# Patient Record
Sex: Male | Born: 1960
Health system: Southern US, Community
[De-identification: ages and names within clinical notes are randomized; demographics above are authoritative.]

## PROBLEM LIST (undated history)

## (undated) DIAGNOSIS — I10 Essential (primary) hypertension: Secondary | ICD-10-CM

## (undated) DIAGNOSIS — T7840XA Allergy, unspecified, initial encounter: Secondary | ICD-10-CM

## (undated) DIAGNOSIS — K219 Gastro-esophageal reflux disease without esophagitis: Secondary | ICD-10-CM

## (undated) DIAGNOSIS — E785 Hyperlipidemia, unspecified: Secondary | ICD-10-CM

## (undated) HISTORY — PX: COLONOSCOPY: SHX174

## (undated) HISTORY — DX: Allergy, unspecified, initial encounter: T78.40XA

## (undated) HISTORY — PX: ANTERIOR CRUCIATE LIGAMENT REPAIR: SHX115

## (undated) HISTORY — DX: Essential (primary) hypertension: I10

## (undated) HISTORY — PX: STENT PLACEMENT ILIAC (ARMC HX): HXRAD1735

## (undated) HISTORY — DX: Gastro-esophageal reflux disease without esophagitis: K21.9

## (undated) HISTORY — DX: Hyperlipidemia, unspecified: E78.5

---

## 2006-11-12 DIAGNOSIS — M109 Gout, unspecified: Secondary | ICD-10-CM | POA: Insufficient documentation

## 2006-11-12 HISTORY — DX: Gout, unspecified: M10.9

## 2008-11-12 HISTORY — PX: OTHER SURGICAL HISTORY: SHX169

## 2008-11-22 ENCOUNTER — Ambulatory Visit: Payer: Self-pay | Admitting: General Practice

## 2009-03-31 ENCOUNTER — Inpatient Hospital Stay: Payer: Self-pay | Admitting: Specialist

## 2009-03-31 ENCOUNTER — Ambulatory Visit: Payer: Self-pay | Admitting: Cardiology

## 2009-05-12 ENCOUNTER — Ambulatory Visit: Payer: Self-pay | Admitting: Specialist

## 2011-02-22 IMAGING — CR DG KNEE COMPLETE 4+V*L*
1 series · 4 of 4 positions shown · non-contrast
Comparison: none

REASON FOR EXAM: fall heard knee "pop," pain swelling
COMMENTS:

PROCEDURE:     DXR - DXR KNEE LT COMP WITH OBLIQUES  - March 31, 2009 [DATE]
RESULT:     No fracture, dislocation or other acute bony abnormality is
identified. The knee joint space is well maintained. The patella is intact.

[Series 1: view not recorded · 0.17mm/px · 4 of 4 slices shown]
[im 1/4]
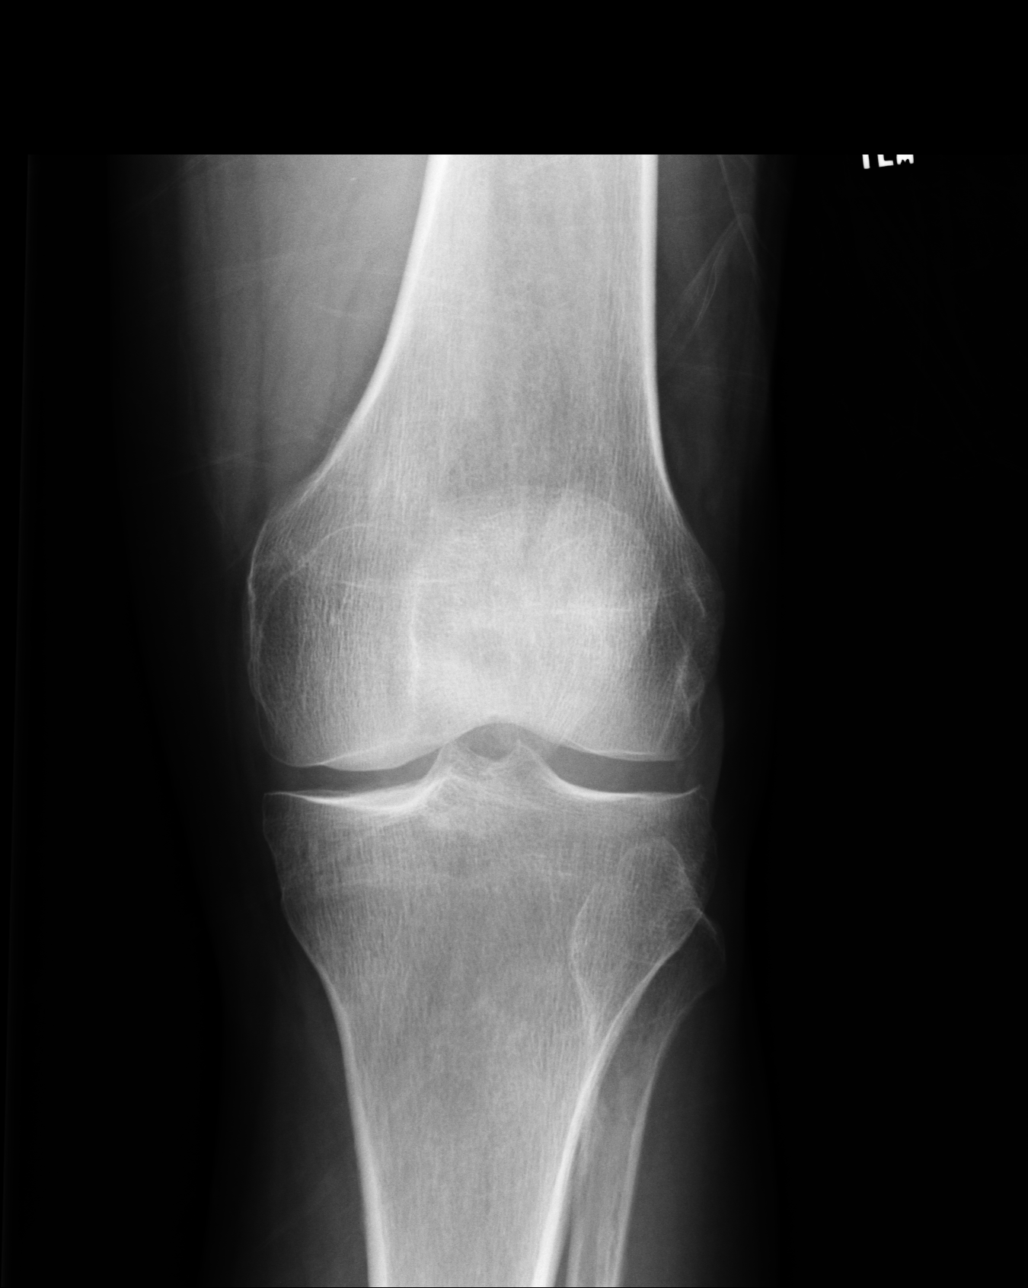
[im 2/4]
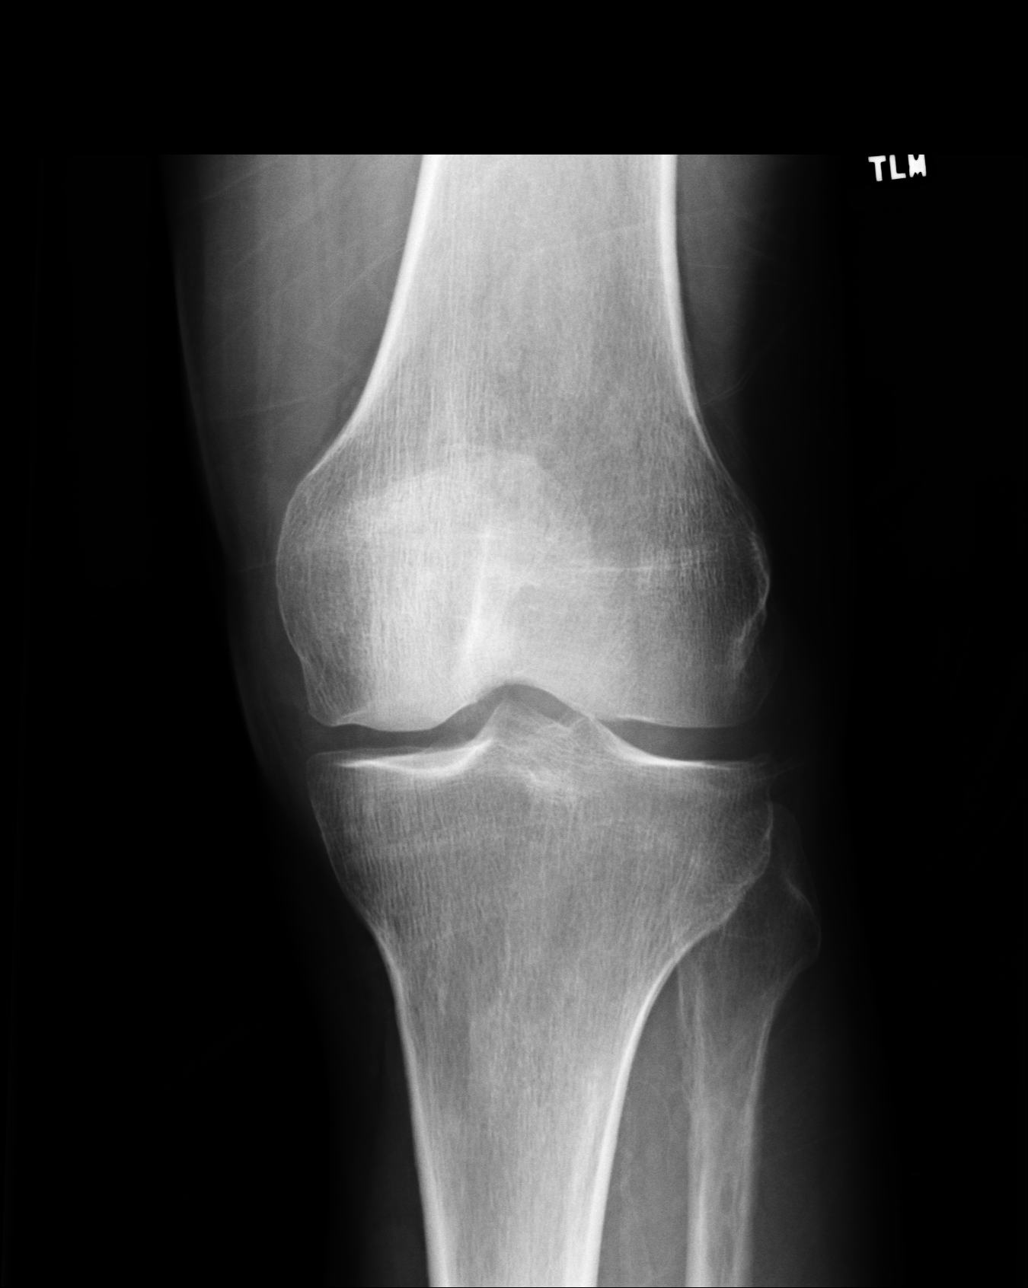
[im 3/4]
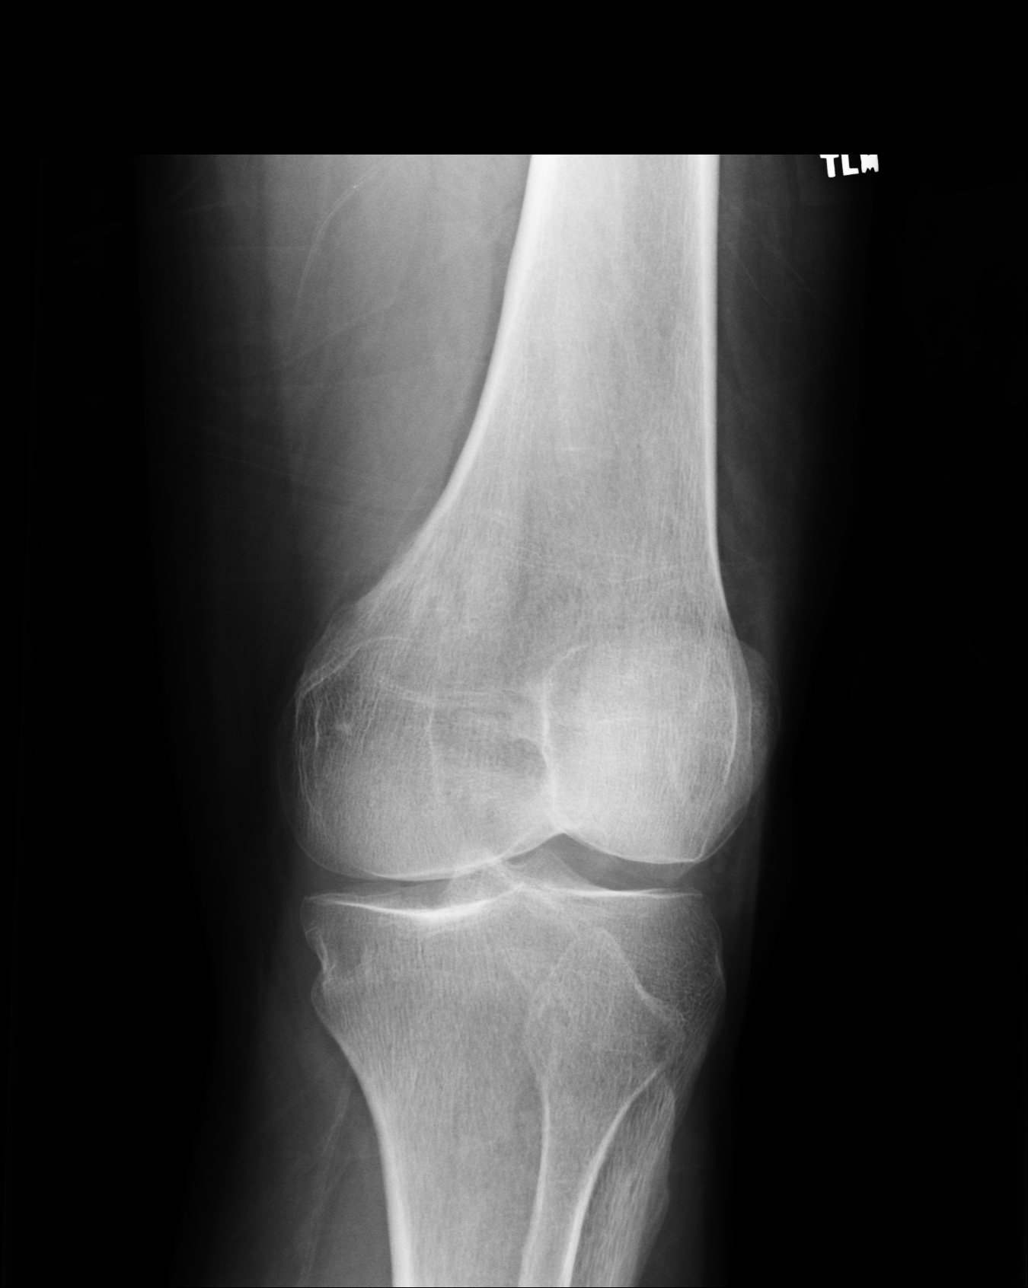
[im 4/4]
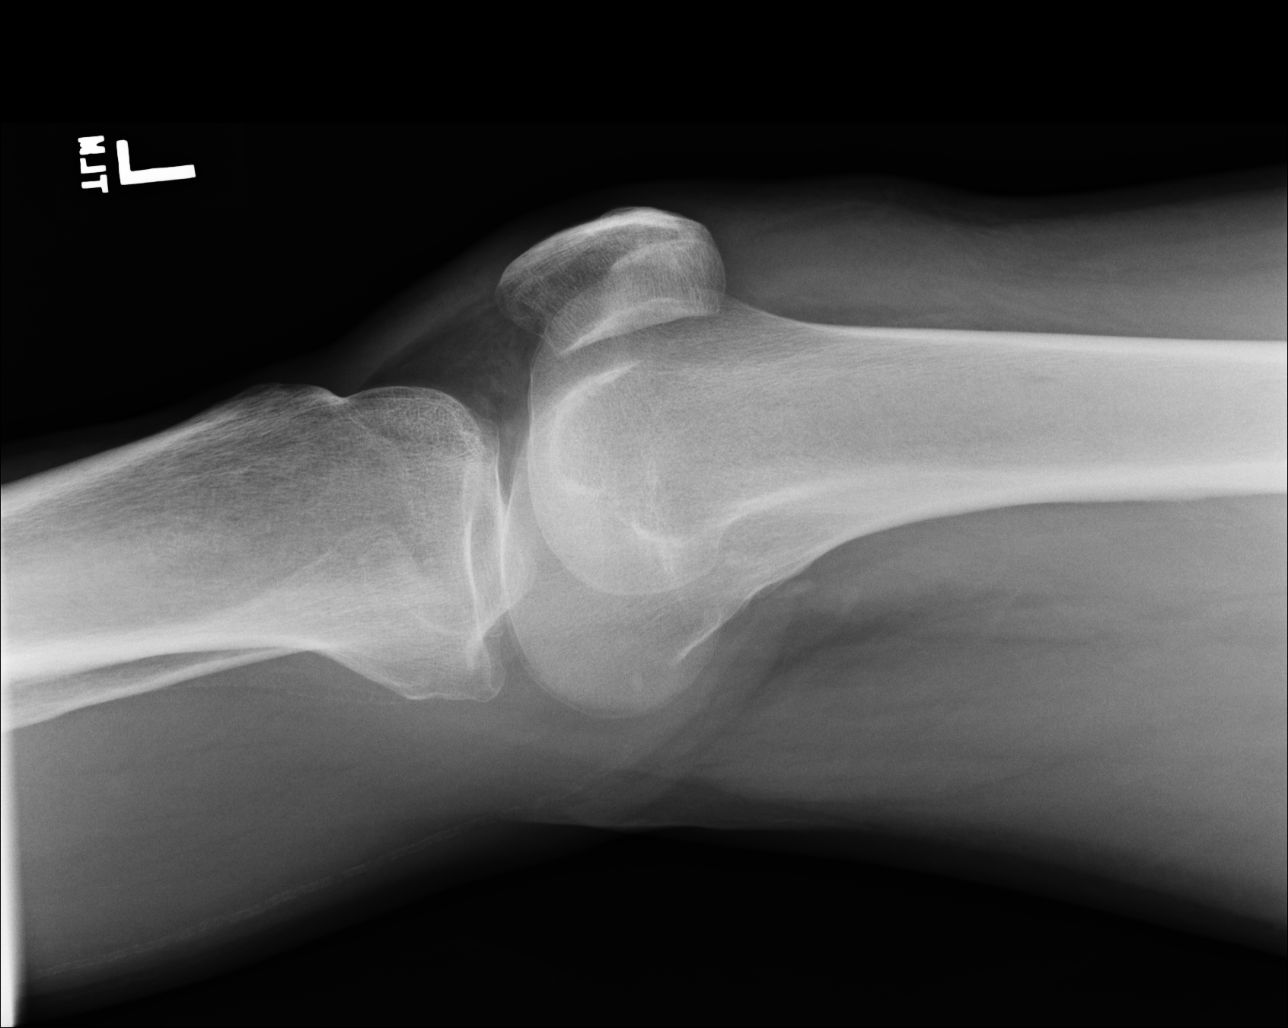

[4 of 4 positions shown; findings below may reference images not displayed]

IMPRESSION: No acute changes are identified.

## 2013-05-12 ENCOUNTER — Ambulatory Visit: Payer: Self-pay | Admitting: Specialist

## 2013-05-12 LAB — POTASSIUM: Potassium: 4.3 mmol/L (ref 3.5–5.1)

## 2013-05-12 LAB — PROTIME-INR
INR: 0.9
Prothrombin Time: 12.1 secs (ref 11.5–14.7)

## 2013-05-12 LAB — URINALYSIS, COMPLETE
Glucose,UR: NEGATIVE mg/dL (ref 0–75)
Ketone: NEGATIVE
Leukocyte Esterase: NEGATIVE
Nitrite: NEGATIVE
Protein: NEGATIVE
RBC,UR: 6 /HPF (ref 0–5)
Squamous Epithelial: <1
WBC UR: 1 /HPF (ref 0–5)

## 2013-05-12 LAB — APTT: Activated PTT: 27.8 secs (ref 23.6–35.9)

## 2013-05-12 LAB — MRSA PCR SCREENING

## 2013-05-20 ENCOUNTER — Inpatient Hospital Stay: Payer: Self-pay | Admitting: Specialist

## 2013-05-21 LAB — HEMOGLOBIN: HGB: 12.4 g/dL — ABNORMAL LOW (ref 13.0–18.0)

## 2013-05-22 LAB — CREATININE, SERUM: EGFR (Non-African Amer.): >60

## 2013-05-22 LAB — PATHOLOGY REPORT

## 2014-06-02 IMAGING — CR PELVIS - 1-2 VIEW
1 series · 1 of 1 positions shown · non-contrast
Comparison: none

REASON FOR EXAM: left hip prosthesis
COMMENTS:

PROCEDURE:     DXR - DXR PELVIS AP ONLY  - May 20, 2013 [DATE]
RESULT:

[ap]
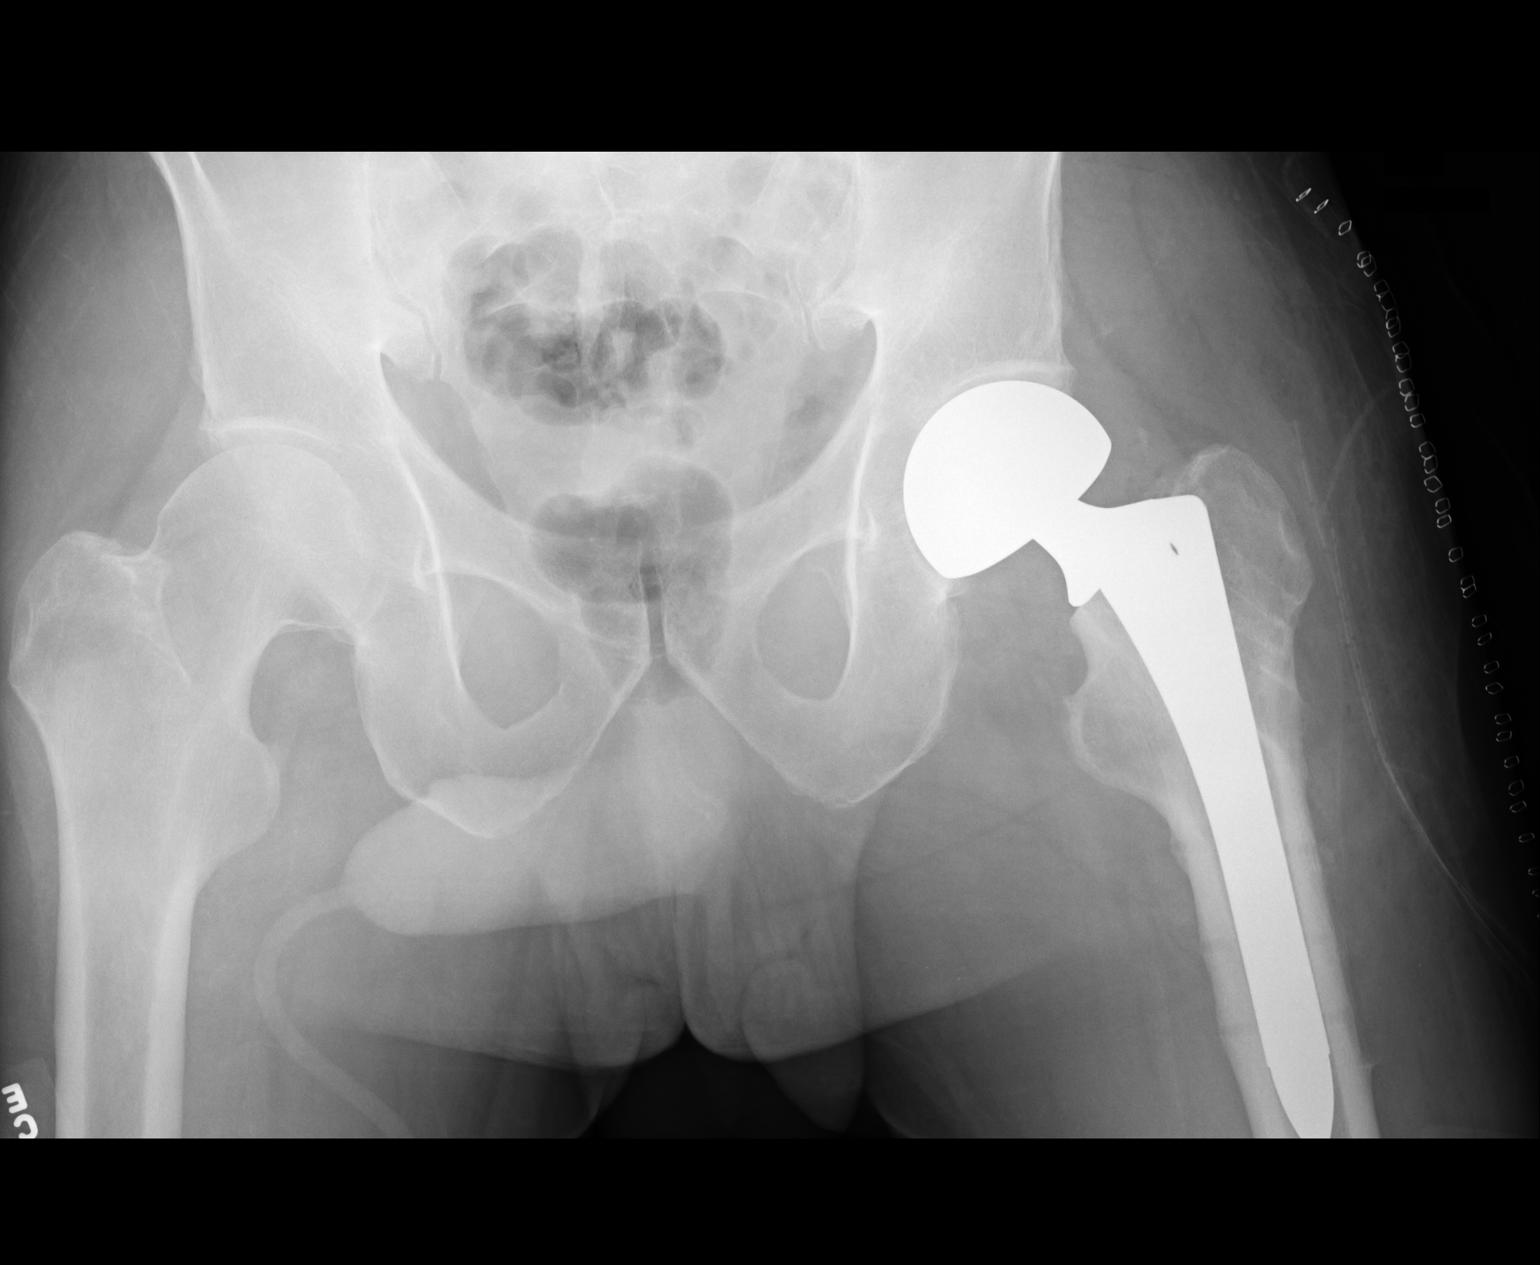

[1 of 1 positions shown; findings below may reference images not displayed]

FINDINGS: The patient is status post left hip replacement. Hardware appears
intact without evidence of loosening or failure. The native osseous
structures demonstrate no evidence of fracture. Skin staples and surgical
drains are appreciated about the left hip.
IMPRESSION: 1. The patient is status post left hip replacement. The remainder of the
interpretation will be left to the performing physician.

## 2015-03-04 NOTE — Discharge Summary (Signed)
PATIENT NAME:  Larry Johnson, Larry Johnson MR#:  [id number] DATE OF BIRTH:  1961-09-27  DATE OF ADMISSION:  05/20/2013 DATE OF DISCHARGE:  05/23/2013  DISCHARGE DIAGNOSES: 1.  Avascular necrosis, left femoral head.  2.  Gastroesophageal reflux disease.  3.  Gout.   OPERATIONS OR PROCEDURES PERFORMED: Bipolar hemiarthroplasty on 05/20/2013.   HISTORY AND PHYSICAL EXAMINATION: As written on admission.   LABORATORY DATA: As noted in the chart.   HOSPITAL COURSE: The patient was admitted through same day surgery and underwent uncomplicated bipolar hemiarthroplasty on 05/20/2013. He tolerated the procedure quite well. He did have significant nausea and vomiting during the admission, which eventually resolved. He was advanced up into the chair in physical therapy with total hip precautions with partial weight-bearing. He was discharged on 05/23/2013 doing well. He was discharged home with home health physical therapy. He was discharged on his previous home medications and on oxycodone 10 mg every 3 hours as necessary for pain. He also is to take aspirin 325 mg twice a day with food as prophylaxis for venous thrombosis and pulmonary embolism. He is to return to the office in 10 days for staple removal. ____________________________ Clare Gandyhristopher E. Ivon Oelkers, MD ces:sb D: 06/09/2013 08:15:37 ET T: 06/09/2013 08:25:30 ET JOB#: [id number]  cc: Clare Gandyhristopher E. Tyr Franca, MD, <Dictator> Clare GandyHRISTOPHER E Alfred Harrel MD ELECTRONICALLY SIGNED 06/09/2013 13:43

## 2015-03-04 NOTE — Op Note (Signed)
PATIENT NAME:  Larry Johnson, Larry Johnson MR#:  [id number] DATE OF BIRTH:  February 07, 1961  DATE OF PROCEDURE:  05/20/2013  PREOPERATIVE DIAGNOSES: Avascular necrosis, left hip.   POSTOPERATIVE DIAGNOSIS: Avascular necrosis, left hip.   PROCEDURE PERFORMED:  1. Bipolar prosthetic hip arthroplasty.  2. Hardware removal, left hip.   SURGEON: Clare Gandyhristopher E. Niara Bunker, MD  ASSISTANT: Valinda HoarHoward E. Miller, MD  ANESTHESIA: Spinal.   COMPLICATIONS: None.   DRAINS: Two Autovacs.   ESTIMATED BLOOD LOSS: 500 mL.   DESCRIPTION OF PROCEDURE: Ancef 2 grams was given intravenously prior to the procedure. Spinal anesthesia is induced. The patient is turned over into the right lateral decubitus position and secured in the usual manner for left hip surgery. The left hip is thoroughly prepped with alcohol and ChloraPrep and draped in standard sterile fashion. A standard posterolateral incision is made, and this is extended distally to account for hardware removal. The three 7.3 cannulated screws are removed as well as the 3 cortical screws in the femoral plate, and the femoral plate and the compression screw in the femoral head and neck. The compression screw in the femoral head and neck had to be removed retrograde after cutting the femoral head. The hip was dislocated, and the femoral head was cut, and the compression screw, as noted above, was removed. The femoral head and neck was then cut off at the appropriate angle and length. It should be noted that careful preservation of the piriformis tendon, which was tagged, and also the capsule, in which a T incision was made, was performed at all times. Sciatic nerve was palpated deep within the wound and was protected throughout the case. The acetabulum was fully inspected and was seen to be excellent, with excellent cartilaginous surfaces. It was felt that bipolar arthroplasty could therefore be performed. The proximal femur was carefully reamed up to a 16 diameter, and then the 6.5  narrow rasp was used. The acetabular sizers were used to size the acetabulum at 55. The joint and all surfaces were thoroughly irrigated multiple times with a pulsatile lavage. The 55 mm bipolar arthroplasty with the 1.5 mm neck was reduced and was seen to be stable with equal leg lengths. These components were then dislocated and removed, and the joint was thoroughly irrigated multiple times, as it was throughout the case. The 16.5 narrow AML porous-coated stem was then impacted into place and was seen to be a solid fit. The 1.5 neck length ball with a 55 mm in diameter DePuy bipolar component was impacted over the femoral stem and neck. This was felt to be secure. The hip was then reduced and was seen to be excellent with respect to stability and leg length. The wound is thoroughly irrigated multiple times again. The posterior capsule is repaired with multiple #2 Ethibond sutures. The piriformis tendon is reattached to the abductor mechanism. The posterior capsule is advanced and repaired to the posterior abductor mechanism. The wound is thoroughly irrigated multiple times again. Two Autovac drains are brought out through separate stab wound incisions. The fascia lata is closed with #2 Tycron. The subcutaneous tissue is closed with #1 Vicryl, and the skin is closed with the skin stapler. A soft bulky dressing is applied. The patient is turned over into the hospital bed and the legs placed in the abduction pillow. The patient was returned to the recovery room in satisfactory condition having tolerated the procedure quite well.   ____________________________ Clare Gandyhristopher E. Reighn Kaplan, MD ces:OSi D: 05/20/2013 11:05:31 ET T: 05/20/2013 11:16:56  ET JOB#: [id number]  cc: Clare Gandy, MD, <Dictator> Clare Gandy MD ELECTRONICALLY SIGNED 05/21/2013 8:29

## 2015-04-13 IMAGING — CR DG HIP 1V*L*
1 series · 1 of 1 positions shown · non-contrast
Comparison: none

REASON FOR EXAM: left hip prosthesis
COMMENTS:

PROCEDURE:     DXR - DXR HIP LEFT ONE VIEW  - May 20, 2013 [DATE]
RESULT:

[lat]
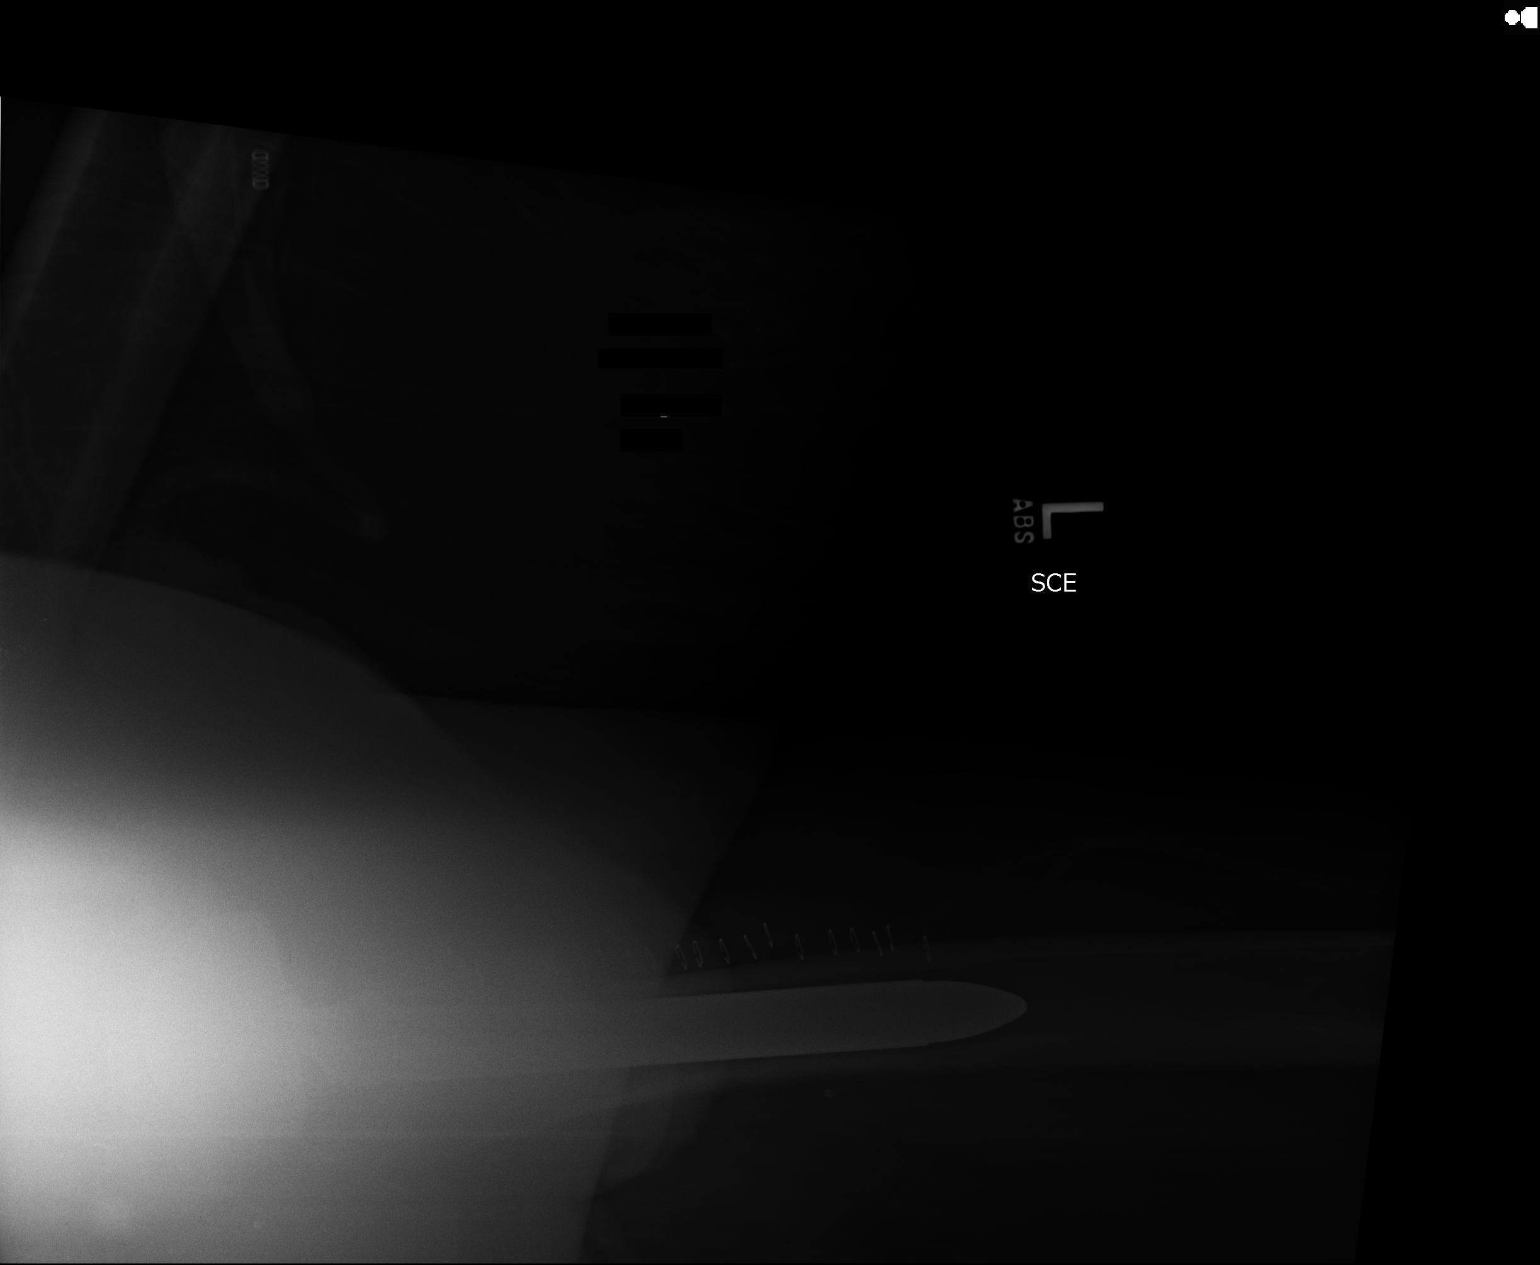

[1 of 1 positions shown; findings below may reference images not displayed]

FINDINGS: Left hip prosthesis is appreciated and appears grossly intact.
This study is limited secondary to technique. Skin staples are appreciated.
IMPRESSION: 1. The patient is status post left hip replacement. The remainder of the
interpretation will be left to the performing physician.

## 2016-07-13 DIAGNOSIS — I2510 Atherosclerotic heart disease of native coronary artery without angina pectoris: Secondary | ICD-10-CM

## 2016-07-13 HISTORY — DX: Atherosclerotic heart disease of native coronary artery without angina pectoris: I25.10

## 2016-07-17 DIAGNOSIS — K219 Gastro-esophageal reflux disease without esophagitis: Secondary | ICD-10-CM | POA: Insufficient documentation

## 2016-07-17 DIAGNOSIS — R079 Chest pain, unspecified: Secondary | ICD-10-CM | POA: Insufficient documentation

## 2016-07-18 DIAGNOSIS — I200 Unstable angina: Secondary | ICD-10-CM | POA: Insufficient documentation

## 2016-07-19 DIAGNOSIS — D696 Thrombocytopenia, unspecified: Secondary | ICD-10-CM | POA: Insufficient documentation

## 2017-09-24 DIAGNOSIS — M1990 Unspecified osteoarthritis, unspecified site: Secondary | ICD-10-CM | POA: Insufficient documentation

## 2019-03-31 ENCOUNTER — Other Ambulatory Visit: Payer: Self-pay

## 2019-04-01 LAB — CMP12+LP+TP+TSH+6AC+PSA+CBC?
ALT: 71 IU/L — ABNORMAL HIGH (ref 0–44)
AST: 41 IU/L — ABNORMAL HIGH (ref 0–40)
Albumin: 4.6 g/dL (ref 3.8–4.9)
BUN/Creatinine Ratio: 13 (ref 10–24)
Basophils Absolute: 0.1 10*3/uL (ref 0.0–0.2)
Calcium: 9.6 mg/dL (ref 8.6–10.2)
Chloride: 104 mmol/L (ref 96–106)
Chol/HDL Ratio: 3.4 ratio (ref 0.0–5.0)
Eos: 7 %
Estimated CHD Risk: 0.5 times avg. (ref 0.0–1.0)
Free Thyroxine Index: 1.7 (ref 1.2–4.9)
GFR calc Af Amer: 109 mL/min/{1.73_m2} (ref >59)
Hemoglobin: 14.3 g/dL (ref 13.0–17.7)
Immature Granulocytes: 0 %
LDH: 174 IU/L (ref 121–224)
MCV: 91 fL (ref 79–97)
Monocytes: 9 %
Neutrophils Absolute: 2.3 10*3/uL (ref 1.4–7.0)
Neutrophils: 46 %
Phosphorus: 3.4 mg/dL (ref 2.8–4.1)
Platelets: 150 10*3/uL (ref 150–450)
RBC: 4.64 x10E6/uL (ref 4.14–5.80)

## 2019-04-01 LAB — CMP12+LP+TP+TSH+6AC+PSA+CBC…
Albumin/Globulin Ratio: 1.9 (ref 1.2–2.2)
Alkaline Phosphatase: 95 IU/L (ref 39–117)
BUN: 11 mg/dL (ref 8–27)
Basos: 1 %
Bilirubin Total: 0.5 mg/dL (ref 0.0–1.2)
Cholesterol, Total: 152 mg/dL (ref 100–199)
Creatinine, Ser: 0.85 mg/dL (ref 0.76–1.27)
EOS (ABSOLUTE): 0.4 10*3/uL (ref 0.0–0.4)
GFR calc non Af Amer: 95 mL/min/{1.73_m2} (ref >59)
GGT: 102 IU/L — ABNORMAL HIGH (ref 0–65)
Globulin, Total: 2.4 g/dL (ref 1.5–4.5)
Glucose: 97 mg/dL (ref 65–99)
HDL: 45 mg/dL (ref >39)
Hematocrit: 42.2 % (ref 37.5–51.0)
Immature Grans (Abs): 0 10*3/uL (ref 0.0–0.1)
Iron: 102 ug/dL (ref 38–169)
LDL Calculated: 59 mg/dL (ref 0–99)
Lymphocytes Absolute: 1.8 10*3/uL (ref 0.7–3.1)
Lymphs: 37 %
MCH: 30.8 pg (ref 26.6–33.0)
MCHC: 33.9 g/dL (ref 31.5–35.7)
Monocytes Absolute: 0.4 10*3/uL (ref 0.1–0.9)
Potassium: 4.4 mmol/L (ref 3.5–5.2)
Prostate Specific Ag, Serum: 0.6 ng/mL (ref 0.0–4.0)
RDW: 13.4 % (ref 11.6–15.4)
Sodium: 143 mmol/L (ref 134–144)
T3 Uptake Ratio: 25 % (ref 24–39)
T4, Total: 6.7 ug/dL (ref 4.5–12.0)
TSH: 2.34 u[IU]/mL (ref 0.450–4.500)
Total Protein: 7 g/dL (ref 6.0–8.5)
Triglycerides: 238 mg/dL — ABNORMAL HIGH (ref 0–149)
Uric Acid: 4.3 mg/dL (ref 3.7–8.6)
VLDL Cholesterol Cal: 48 mg/dL — ABNORMAL HIGH (ref 5–40)
WBC: 5 10*3/uL (ref 3.4–10.8)

## 2019-09-24 ENCOUNTER — Other Ambulatory Visit: Payer: Self-pay

## 2019-09-24 DIAGNOSIS — K219 Gastro-esophageal reflux disease without esophagitis: Secondary | ICD-10-CM

## 2019-09-25 ENCOUNTER — Other Ambulatory Visit: Payer: Self-pay | Admitting: Physician Assistant

## 2019-09-25 MED ORDER — OMEPRAZOLE 20 MG PO CPDR: DELAYED_RELEASE_CAPSULE | ORAL | 1 refills | Status: DC

## 2019-09-25 NOTE — Progress Notes (Signed)
Up to Date with routine annual Dr Roxan Hockey 03/2019- continue meds as ordered

## 2019-10-02 ENCOUNTER — Other Ambulatory Visit: Payer: Self-pay

## 2019-10-02 DIAGNOSIS — I10 Essential (primary) hypertension: Secondary | ICD-10-CM

## 2019-10-02 MED ORDER — LISINOPRIL-HYDROCHLOROTHIAZIDE 10-12.5 MG PO TABS: 1.0000 | ORAL_TABLET | Freq: Every morning | ORAL | 0 refills | Status: DC

## 2019-12-29 ENCOUNTER — Other Ambulatory Visit: Payer: Self-pay

## 2019-12-29 DIAGNOSIS — I2510 Atherosclerotic heart disease of native coronary artery without angina pectoris: Secondary | ICD-10-CM

## 2019-12-29 DIAGNOSIS — Z792 Long term (current) use of antibiotics: Secondary | ICD-10-CM

## 2019-12-29 DIAGNOSIS — M1A9XX1 Chronic gout, unspecified, with tophus (tophi): Secondary | ICD-10-CM

## 2019-12-30 MED ORDER — AMOXICILLIN-POT CLAVULANATE 875-125 MG PO TABS: ORAL_TABLET | ORAL | 0 refills | Status: DC

## 2019-12-30 MED ORDER — ALLOPURINOL 300 MG PO TABS: 300.0000 mg | ORAL_TABLET | Freq: Every day | ORAL | 1 refills | Status: DC

## 2019-12-30 NOTE — Telephone Encounter (Signed)
Reviewed paper chart at [facility] and [address].  Last labs 03/31/2019  Renal function normal and slightly elevated LFTs.  Electronic Rx allopurinol 300mg  po daily #90 RF1 sent to his pharmacy of choice.  I contacted [facility] and patient does not have procedure pending or appt scheduled at this time.  Staff at their office stated they will prescribe any required antibiotics after he is seen in their office.  He is to contact his oral surgeon if antibiotics required for dental needs at this time for infection after procedure or provider wants him to take preprocedure.  Per chart review 2017 Dr 2018 prescribed augmentin 875mg  po take 1 tablet 1 hour prior to dental procedure and another 6 hours after dental procedure #2 RF0 due to hip replacement.  He again filled for patient twice in 2018, 2019 and [facility] 2020 x1.  Renewed Rx for one time use today electronic sent to his pharmacy of choice.  Labs and physical Due May 2021.

## 2020-01-15 ENCOUNTER — Other Ambulatory Visit: Payer: Self-pay

## 2020-01-15 DIAGNOSIS — I10 Essential (primary) hypertension: Secondary | ICD-10-CM

## 2020-01-15 MED ORDER — LISINOPRIL-HYDROCHLOROTHIAZIDE 10-12.5 MG PO TABS: 1.0000 | ORAL_TABLET | Freq: Every morning | ORAL | 0 refills | Status: DC

## 2020-01-15 NOTE — Telephone Encounter (Signed)
Please schedule patient for annual exam in late April so up to date for May refills. See paper chart notes Dr Dorris Fetch-

## 2020-01-21 ENCOUNTER — Telehealth: Payer: Self-pay | Admitting: Physician Assistant

## 2020-01-21 MED ORDER — METOPROLOL SUCCINATE ER 50 MG PO TB24: 50.0000 mg | ORAL_TABLET | Freq: Every day | ORAL | 0 refills | Status: DC

## 2020-01-22 ENCOUNTER — Other Ambulatory Visit: Payer: Self-pay

## 2020-01-23 MED ORDER — ATORVASTATIN CALCIUM 80 MG PO TABS: 80.0000 mg | ORAL_TABLET | Freq: Every day | ORAL | 0 refills | Status: DC

## 2020-02-03 ENCOUNTER — Ambulatory Visit: Payer: Self-pay

## 2020-02-03 ENCOUNTER — Other Ambulatory Visit: Payer: Self-pay

## 2020-02-03 DIAGNOSIS — Z0000 Encounter for general adult medical examination without abnormal findings: Secondary | ICD-10-CM

## 2020-02-03 LAB — POCT URINALYSIS DIPSTICK
Bilirubin, UA: NEGATIVE
Blood, UA: NEGATIVE
Glucose, UA: NEGATIVE
Ketones, UA: NEGATIVE
Leukocytes, UA: NEGATIVE
Nitrite, UA: NEGATIVE
Protein, UA: POSITIVE — AB
Spec Grav, UA: >=1.03 — AB (ref 1.010–1.025)
Urobilinogen, UA: 0.2 E.U./dL
pH, UA: 5.5 (ref 5.0–8.0)

## 2020-02-03 NOTE — Progress Notes (Signed)
Scheduled to complete physical 02/10/20 with Albina Billet, NP-C.  AMD

## 2020-02-04 LAB — CMP12+LP+TP+TSH+6AC+PSA+CBC…
ALT: 72 IU/L — ABNORMAL HIGH (ref 0–44)
AST: 41 IU/L — ABNORMAL HIGH (ref 0–40)
Albumin/Globulin Ratio: 2 (ref 1.2–2.2)
Albumin: 4.8 g/dL (ref 3.8–4.8)
Alkaline Phosphatase: 109 IU/L (ref 39–117)
BUN/Creatinine Ratio: 16 (ref 10–24)
BUN: 13 mg/dL (ref 8–27)
Basophils Absolute: 0 10*3/uL (ref 0.0–0.2)
Basos: 1 %
Bilirubin Total: 0.4 mg/dL (ref 0.0–1.2)
Calcium: 9.5 mg/dL (ref 8.6–10.2)
Chloride: 100 mmol/L (ref 96–106)
Chol/HDL Ratio: 3.2 ratio (ref 0.0–5.0)
Cholesterol, Total: 159 mg/dL (ref 100–199)
Creatinine, Ser: 0.8 mg/dL (ref 0.76–1.27)
EOS (ABSOLUTE): 0.2 10*3/uL (ref 0.0–0.4)
Eos: 4 %
Estimated CHD Risk: <0.5 times avg. (ref 0.0–1.0)
Free Thyroxine Index: 1.9 (ref 1.2–4.9)
GFR calc Af Amer: 111 mL/min/{1.73_m2} (ref >59)
GFR calc non Af Amer: 96 mL/min/{1.73_m2} (ref >59)
GGT: 90 IU/L — ABNORMAL HIGH (ref 0–65)
Globulin, Total: 2.4 g/dL (ref 1.5–4.5)
Glucose: 99 mg/dL (ref 65–99)
HDL: 49 mg/dL (ref >39)
Hematocrit: 42.6 % (ref 37.5–51.0)
Hemoglobin: 14.4 g/dL (ref 13.0–17.7)
Immature Grans (Abs): 0 10*3/uL (ref 0.0–0.1)
Immature Granulocytes: 0 %
Iron: 86 ug/dL (ref 38–169)
LDH: 159 IU/L (ref 121–224)
LDL Chol Calc (NIH): 75 mg/dL (ref 0–99)
Lymphocytes Absolute: 2.1 10*3/uL (ref 0.7–3.1)
Lymphs: 40 %
MCH: 29.8 pg (ref 26.6–33.0)
MCHC: 33.8 g/dL (ref 31.5–35.7)
MCV: 88 fL (ref 79–97)
Monocytes Absolute: 0.5 10*3/uL (ref 0.1–0.9)
Monocytes: 9 %
Neutrophils Absolute: 2.5 10*3/uL (ref 1.4–7.0)
Neutrophils: 46 %
Phosphorus: 3.4 mg/dL (ref 2.8–4.1)
Platelets: 162 10*3/uL (ref 150–450)
Potassium: 4.3 mmol/L (ref 3.5–5.2)
Prostate Specific Ag, Serum: 0.7 ng/mL (ref 0.0–4.0)
RBC: 4.83 x10E6/uL (ref 4.14–5.80)
RDW: 12.9 % (ref 11.6–15.4)
Sodium: 140 mmol/L (ref 134–144)
T3 Uptake Ratio: 28 % (ref 24–39)
T4, Total: 6.9 ug/dL (ref 4.5–12.0)
TSH: 2.43 u[IU]/mL (ref 0.450–4.500)
Total Protein: 7.2 g/dL (ref 6.0–8.5)
Triglycerides: 212 mg/dL — ABNORMAL HIGH (ref 0–149)
Uric Acid: 4.2 mg/dL (ref 3.8–8.4)
VLDL Cholesterol Cal: 35 mg/dL (ref 5–40)
WBC: 5.3 10*3/uL (ref 3.4–10.8)

## 2020-02-09 DIAGNOSIS — H9190 Unspecified hearing loss, unspecified ear: Secondary | ICD-10-CM | POA: Insufficient documentation

## 2020-02-09 DIAGNOSIS — E663 Overweight: Secondary | ICD-10-CM | POA: Insufficient documentation

## 2020-02-09 DIAGNOSIS — J309 Allergic rhinitis, unspecified: Secondary | ICD-10-CM | POA: Insufficient documentation

## 2020-02-09 DIAGNOSIS — E7800 Pure hypercholesterolemia, unspecified: Secondary | ICD-10-CM | POA: Insufficient documentation

## 2020-02-09 NOTE — Progress Notes (Signed)
Labs & EKG completed 02/03/20.  AMD

## 2020-02-10 ENCOUNTER — Encounter: Payer: Self-pay | Admitting: Registered Nurse

## 2020-02-10 ENCOUNTER — Other Ambulatory Visit: Payer: Self-pay

## 2020-02-10 ENCOUNTER — Ambulatory Visit: Payer: Self-pay | Admitting: Registered Nurse

## 2020-02-10 VITALS — BP 122/80 | HR 68 | Temp 98.6°F | Resp 12 | Ht 73.0 in | Wt 225.0 lb

## 2020-02-10 DIAGNOSIS — K439 Ventral hernia without obstruction or gangrene: Secondary | ICD-10-CM

## 2020-02-10 DIAGNOSIS — J309 Allergic rhinitis, unspecified: Secondary | ICD-10-CM

## 2020-02-10 DIAGNOSIS — E663 Overweight: Secondary | ICD-10-CM

## 2020-02-10 DIAGNOSIS — Z23 Encounter for immunization: Secondary | ICD-10-CM

## 2020-02-10 DIAGNOSIS — R7401 Elevation of levels of liver transaminase levels: Secondary | ICD-10-CM

## 2020-02-10 DIAGNOSIS — E7800 Pure hypercholesterolemia, unspecified: Secondary | ICD-10-CM

## 2020-02-10 DIAGNOSIS — Z86010 Personal history of colonic polyps: Secondary | ICD-10-CM

## 2020-02-10 DIAGNOSIS — I10 Essential (primary) hypertension: Secondary | ICD-10-CM

## 2020-02-10 DIAGNOSIS — R202 Paresthesia of skin: Secondary | ICD-10-CM

## 2020-02-10 DIAGNOSIS — I2510 Atherosclerotic heart disease of native coronary artery without angina pectoris: Secondary | ICD-10-CM

## 2020-02-10 DIAGNOSIS — H9190 Unspecified hearing loss, unspecified ear: Secondary | ICD-10-CM

## 2020-02-10 DIAGNOSIS — K219 Gastro-esophageal reflux disease without esophagitis: Secondary | ICD-10-CM

## 2020-02-10 DIAGNOSIS — Z8739 Personal history of other diseases of the musculoskeletal system and connective tissue: Secondary | ICD-10-CM

## 2020-02-10 DIAGNOSIS — R748 Abnormal levels of other serum enzymes: Secondary | ICD-10-CM

## 2020-02-10 DIAGNOSIS — R809 Proteinuria, unspecified: Secondary | ICD-10-CM

## 2020-02-10 NOTE — Progress Notes (Signed)
Subjective:    Patient ID: Larry Johnson, male    DOB: [birth year], 59 y.o.   MRN: [id number]  59y/o caucasian male established patient last seen physical Dr Dorris Fetch 04/07/2019.  Has not seen cardiology since 2019.  Patient reported no gout flares since taking allopurinol.  PA [facility] patient chronic meds earlier this month.  Patient reported still drinking about 6 beers per day.  Retired.  Works around the yard some.  Has flip phone doesn't monitor steps.  Seasonal allergies worst time usually May/spring pollen claritin helps doesn't use nasal saline  Hasn't had covid vaccine yet wife going to schedule him.  Hasn't had repeat colonoscopy yet due to stents/blood thinners.  Denied heartburn or dysphagia as long as he takes his omeprazole.  Ventral hernia not any bigger than last exam per patient.  Denied headaches/visual changes hasn't seen optometry in over 5 years has reading glasses. Notes sock lines some lower leg swelling end of day left greater than right ankle. Sometimes leg cramps in the morning with stretching drinks powerade or gatorade if during the day and he is sweating a lot.  Dental exam/cleaning every 6 months.  Per paper chart review maintained at [facility] clnic last  Td 2010  CAD sees cardiology [facility] last appt 2019 per Epic stent 03/2018 switched to plavix, elevated lipids continue statin, hypertension continue meds lisinopril, hydrochlorothiazide, metoprolol ER 50mg , GERD prilosec, gout hx no recent flares allopurinol, prior hip replacement left stable  Knee also post op, elevated LFTs ?ETOH versus fatty liver told to decrease alcohol intake to 2 servings per day seasonal allergic rhinitis controlled with claritin obesity overweight holding at 225lbs  R ACL replaced 20 years ago small ventral hernia monitor  Weight 225.4 height 6.1 and bp 140/82 1+ billi on urinalysis otherwise normal.  Year prior with Dr 138/82 weight 225lbs  Colonoscopy due 2017 completed  06/05/2011 [facility] 2 small colon polyps, diverticulosisi sigmoid, and internal hemorrhoids repeat in 5 years Rx history metoprolol 25mg  po BID 08/2016-08/2017 metoprolol ER 50mg  daily 2020; allopurinol 300 mg po daily 2008-present; plavix 75mg  po daily, indocin 50mg  2008-2009, mobic 7.5mg  po qam 2017, prilosec 20mg  1-2 daily prn 03/2017-present prilosec 20mg  daily 2011-2015; lipitor 80mg  08/2016-present; claritin po daily; lisinopril 10/12.5mg  po daily 08/2013-present; augmentin 875mg  one before and after dental cleaning 06/2017; aspirin 81mg  po daily; Brillinta 90mg  po BID 09/2016 and DC'd due to low platelets, aldara 5% cream Jan 2018; MVI  PMHx overweight, hyperlipidemia, moles, gout, elevated LFT, right ACLS repair CAD s/p stent DES LAD 07/2016 and 02/2018; left hip fracture 2010 and replacement; decreased hearing, GERD, HTN, allergic rhintiis Lab history 12/05 to 2020 total cholesterol 196-263 trigs 91-214 HDL 43-61 LDL 53-174 ALT 25-103 AST 09/2013  03/2019 ALT 41 prev 53 ALT 71 prev 84 GGT 102 prev 170 TC 152 Trig 238 prev 189 HDL 45 LDL 59 prev 76 PSA 0.6 platelets 150  Proteinuria noted on urinalysis 1+ 2012 and 2017 otherwise negative annually     Review of Systems  Constitutional: Negative for activity change, appetite change, chills, diaphoresis, fatigue, fever and unexpected weight change.  HENT: Negative for dental problem, nosebleeds, sinus pressure, sinus pain, trouble swallowing and voice change.   Eyes: Negative for photophobia, pain, discharge, redness, itching and visual disturbance.  Respiratory: Negative for cough, shortness of breath, wheezing and stridor.   Cardiovascular: Negative for chest pain, palpitations and leg swelling.  Gastrointestinal: Negative for abdominal distention, abdominal pain, anal  bleeding, blood in stool, constipation, diarrhea, nausea and vomiting.  Endocrine: Negative for cold intolerance, heat intolerance, polydipsia, polyphagia and polyuria.    Genitourinary: Negative for decreased urine volume, difficulty urinating, dysuria, scrotal swelling, testicular pain and urgency.  Musculoskeletal: Negative for arthralgias, back pain, gait problem, joint swelling, neck pain and neck stiffness.  Skin: Negative for rash.  Allergic/Immunologic: Positive for environmental allergies. Negative for food allergies.  Neurological: Negative for dizziness, tremors, seizures, syncope, facial asymmetry, speech difficulty, weakness, light-headedness, numbness and headaches.  Hematological: Negative for adenopathy. Does not bruise/bleed easily.  Psychiatric/Behavioral: Negative for agitation, confusion and sleep disturbance.       Objective:   Physical Exam Vitals and nursing note reviewed.  Constitutional:      General: He is awake. He is not in acute distress.    Appearance: Normal appearance. He is well-developed, well-groomed and overweight. He is not ill-appearing, toxic-appearing or diaphoretic.  HENT:     Head: Normocephalic and atraumatic.     Jaw: There is normal jaw occlusion. No trismus.     Salivary Glands: Right salivary gland is not diffusely enlarged or tender. Left salivary gland is not diffusely enlarged or tender.     Right Ear: Hearing, ear canal and external ear normal. A middle ear effusion is present. There is no impacted cerumen.     Left Ear: Hearing, ear canal and external ear normal. A middle ear effusion is present. There is no impacted cerumen.     Nose: No nasal deformity, septal deviation, signs of injury, laceration, nasal tenderness, mucosal edema, congestion or rhinorrhea.     Right Sinus: No maxillary sinus tenderness or frontal sinus tenderness.     Left Sinus: No maxillary sinus tenderness or frontal sinus tenderness.     Mouth/Throat:     Lips: Pink. No lesions.     Mouth: Mucous membranes are moist. Mucous membranes are not pale, not dry and not cyanotic. No lacerations or angioedema.     Pharynx: Oropharynx is  clear.  Eyes:     General: Lids are normal. Vision grossly intact. Gaze aligned appropriately. Allergic shiner present. No visual field deficit or scleral icterus.       Right eye: No foreign body, discharge or hordeolum.        Left eye: No foreign body, discharge or hordeolum.     Extraocular Movements: Extraocular movements intact.     Right eye: Normal extraocular motion and no nystagmus.     Left eye: Normal extraocular motion and no nystagmus.     Conjunctiva/sclera: Conjunctivae normal.     Right eye: Right conjunctiva is not injected. No chemosis, exudate or hemorrhage.    Left eye: Left conjunctiva is not injected. No chemosis, exudate or hemorrhage.    Pupils: Pupils are equal, round, and reactive to light. Pupils are equal.     Right eye: Pupil is round and reactive.     Left eye: Pupil is round and reactive.  Neck:     Thyroid: No thyroid mass, thyromegaly or thyroid tenderness.     Vascular: No carotid bruit.     Trachea: Trachea and phonation normal. No tracheal tenderness or tracheal deviation.  Cardiovascular:     Rate and Rhythm: Normal rate and regular rhythm.     Chest Wall: PMI is not displaced.     Pulses:          Radial pulses are 2+ on the right side and 2+ on the left side.  Heart sounds: Normal heart sounds, S1 normal and S2 normal. No murmur. No friction rub. No gallop.   Pulmonary:     Effort: Pulmonary effort is normal. No respiratory distress.     Breath sounds: Normal breath sounds and air entry. No stridor, decreased air movement or transmitted upper airway sounds. No decreased breath sounds, wheezing, rhonchi or rales.     Comments: Wearing cloth baklava as mask; spoke full sentences without difficulty; no cough observed in exam room Chest:     Chest wall: No tenderness.  Abdominal:     General: Abdomen is flat. Bowel sounds are normal. There is no distension.     Palpations: Abdomen is soft. There is no mass.     Tenderness: There is no abdominal  tenderness. There is no right CVA tenderness, left CVA tenderness, guarding or rebound. Negative signs include Murphy's sign.     Hernia: A hernia is present. Hernia is present in the ventral area. There is no hernia in the umbilical area.     Comments: With abdominal crunch small midline ventral hernia bulge nontender resolves with supine/sitting/standing position  Musculoskeletal:        General: No swelling, tenderness, deformity or signs of injury. Normal range of motion.     Right shoulder: Normal.     Left shoulder: Normal.     Right elbow: Normal.     Left elbow: Normal.     Right wrist: Normal.     Left wrist: Normal.     Right hand: Normal.     Left hand: Normal.     Cervical back: Normal, normal range of motion and neck supple. No swelling, edema, deformity, erythema, signs of trauma, lacerations, rigidity, spasms, torticollis, tenderness, bony tenderness or crepitus. No pain with movement, spinous process tenderness or muscular tenderness. Normal range of motion.     Thoracic back: Normal.     Lumbar back: Normal.     Right hip: Normal.     Left hip: Normal.     Right knee: Normal.     Left knee: Normal.     Right lower leg: No edema.     Left lower leg: No edema.     Right ankle: Normal.     Left ankle: Normal.     Comments: Left ankle crepitus with AROM; able to squat to chair level bilateral AROM knees and hips equal normal heel/toe gait  Lymphadenopathy:     Head:     Right side of head: No submental, submandibular, tonsillar, preauricular, posterior auricular or occipital adenopathy.     Left side of head: No submental, submandibular, tonsillar, preauricular, posterior auricular or occipital adenopathy.     Cervical: No cervical adenopathy.     Right cervical: No superficial, deep or posterior cervical adenopathy.    Left cervical: No superficial, deep or posterior cervical adenopathy.  Skin:    General: Skin is warm and dry.     Capillary Refill: Capillary refill  takes less than 2 seconds.     Coloration: Skin is not ashen, cyanotic, jaundiced, mottled, pale or sallow.     Findings: No abrasion, abscess, acne, bruising, burn, ecchymosis, erythema, signs of injury, laceration, lesion, petechiae or rash.     Nails: There is no clubbing.  Neurological:     General: No focal deficit present.     Mental Status: He is alert and oriented to person, place, and time. Mental status is at baseline.     GCS: GCS eye subscore  is 4. GCS verbal subscore is 5. GCS motor subscore is 6.     Cranial Nerves: Cranial nerves are intact. No cranial nerve deficit, dysarthria or facial asymmetry.     Sensory: Sensation is intact. No sensory deficit.     Motor: Motor function is intact. No weakness, tremor, atrophy, abnormal muscle tone or seizure activity.     Coordination: Coordination is intact. Coordination normal.     Gait: Gait is intact. Gait normal.     Deep Tendon Reflexes:     Reflex Scores:      Patellar reflexes are 2+ on the right side and 2+ on the left side.    Comments: On/off exam table and in/out of chair without difficulty; gait sure and steady in clinic; bilateral hand grasp equal 5/5  Psychiatric:        Attention and Perception: Attention and perception normal.        Mood and Affect: Mood and affect normal.        Speech: Speech normal.        Behavior: Behavior normal. Behavior is cooperative.        Thought Content: Thought content normal.        Cognition and Memory: Cognition and memory normal.        Judgment: Judgment normal.   EKG sinus bradycardia unchanged from previous 2015, 2016, 2018, 2020 reviewed in paper chart.  Glucose 65 - 99 mg/dL 99  97     Uric Acid 3.8 - 8.4 mg/dL 4.2  4.3 R, CM     Comment: Therapeutic target for gout patients: <6.0  BUN 8 - 27 mg/dL 13  11     Creatinine, Ser 0.76 - 1.27 mg/dL 7.82  9.56  2.13 R    GFR calc non Af Amer >59 mL/min/1.73 96  59 mL/min/1.73" class="[id number]     GFR calc Af Amer >59  mL/min/1.73 111  59 mL/min/1.73" class="[id number]     BUN/Creatinine Ratio 10 - 24 16  13      Sodium 134 - 144 mmol/L 140  143     Potassium 3.5 - 5.2 mmol/L 4.3  4.4   4.3 R   Chloride 96 - 106 mmol/L 100  104     Calcium 8.6 - 10.2 mg/dL 9.5  9.6     Phosphorus 2.8 - 4.1 mg/dL 3.4  3.4     Total Protein 6.0 - 8.5 g/dL 7.2  7.0     Albumin 3.8 - 4.8 g/dL 4.8  4.6 R     Globulin, Total 1.5 - 4.5 g/dL 2.4  2.4     Albumin/Globulin Ratio 1.2 - 2.2 2.0  1.9     Bilirubin Total 0.0 - 1.2 mg/dL 0.4  0.5     Alkaline Phosphatase 39 - 117 IU/L 109  95     LDH 121 - 224 IU/L 159  174     AST 0 - 40 IU/L 41 41    ALT 0 - 44 IU/L 72 71    GGT 0 - 65 IU/L 90 102    Iron 38 - 169 ug/dL 86      Cholesterol, Total 100 - 199 mg/dL 440  102     Triglycerides 0 - 149 mg/dL 725 366    HDL 440 mg/dL 49  39 mg/dL" >34 [id number]     VLDL Cholesterol Cal 5 - 40 mg/dL 35  48    LDL Chol Calc (NIH) 0 - 99 mg/dL  75      Chol/HDL Ratio 0.0 - 5.0 ratio 3.2  3.4 CM     Comment: T. Chol/HDL Ratio  Men Women  1/2 Avg.Risk 3.4 3.3  Avg.Risk 5.0 4.4  2X Avg.Risk 9.6 7.1  3X Avg.Risk 23.4 11.0    Estimated CHD Risk 0.0 - 1.0 times avg. < 0.5  0.5 CM     Comment: The CHD Risk is based on the T. Chol/HDL ratio. Other  factors affect CHD Risk such as hypertension, smoking,  diabetes, severe obesity, and family history of  premature CHD.    TSH 0.450 - 4.500 uIU/mL 2.430  2.340     T4, Total 4.5 - 12.0 ug/dL 6.9  6.7     T3 Uptake Ratio 24 - 39 % 28  25     Free Thyroxine Index 1.2 - 4.9 1.9  1.7     Prostate Specific Ag, Serum 0.0 - 4.0 ng/mL 0.7  0.6 CM     Comment: Roche ECLIA methodology.  According to the American Urological Association, Serum PSA should  decrease and remain at undetectable levels after radical  prostatectomy. The AUA defines biochemical recurrence as an initial  PSA value 0.2 ng/mL or greater followed by a subsequent confirmatory  PSA value 0.2 ng/mL or  greater.  Values obtained with different assay methods or kits cannot be used  interchangeably. Results cannot be interpreted as absolute evidence  of the presence or absence of malignant disease.    WBC 3.4 - 10.8 x10E3/uL 5.3  5.0     RBC 4.14 - 5.80 x10E6/uL 4.83  4.64     Hemoglobin 13.0 - 17.7 g/dL 16.114.4  09.614.3     Hematocrit 37.5 - 51.0 % 42.6  42.2     MCV 79 - 97 fL 88  91     MCH 26.6 - 33.0 pg 29.8  30.8     MCHC 31.5 - 35.7 g/dL 04.533.8  40.933.9     RDW 81.111.6 - 15.4 % 12.9  13.4     Platelets 150 - 450 x10E3/uL 162  150     Neutrophils Not Estab. % 46  46     Lymphs Not Estab. % 40  37     Monocytes Not Estab. % 9  9     Eos Not Estab. % 4  7     Basos Not Estab. % 1  1     Neutrophils Absolute 1.4 - 7.0 x10E3/uL 2.5  2.3     Lymphocytes Absolute 0.7 - 3.1 x10E3/uL 2.1  1.8     Monocytes Absolute 0.1 - 0.9 x10E3/uL 0.5  0.4     EOS (ABSOLUTE) 0.0 - 0.4 x10E3/uL 0.2  0.4     Basophils Absolute 0.0 - 0.2 x10E3/uL 0.0  0.1     Immature Granulocytes Not Estab. % 0  0     Immature Grans (Abs) 0.0 - 0.1 x10E3/uL 0.0  0.0     Resulting Agency  [facility]     Narrative Performed by: [facility]  Performed at: [address] - [facility]  [address], [place], [place] [id number]  Lab Director: Jolene SchimkeSanjai Nagendra MD, Phone: [phone]   Specimen Collected: 02/03/20 08:31 Last Resulted: 02/04/20 08:13  Lab Flowsheet  Order Details  View Encounter  Lab and Collection Details  Routing  Result History    CM=Additional comments R=Reference range differs from displayed range   Result Communications  Result Notes   Please  pull paper chart and place on my shelf and print labs to discuss with patient at follow up appt. Follow up elevated cholesterol (triglycerides), and liver enzymes (AST/ALT/GGT) Atorvastatin could be causing some liver irritation also. Liver enzyme levels stable from previous. I plan to give handouts on cholesterol, high fiber diet  and liver function tests. If drinking alcohol I recommend decreasing intake. I recommend weight loss, exercise 150 minutes per week; dietary fiber 30 grams per day men; eat whole grains/fruits/vegetables; keep added sugars to less than 150 calories/9 teaspoons for men per American Heart Association; blood sugar, prostate, electrolytes, iron, kidney function, thyroid and complete blood count normal my chart message sent to patient      Comments to Patient Not seen     Merry Proud, Follow up elevated cholesterol (triglycerides), and liver enzymes (AST/ALT/GGT) Atorvastatin could be causing some liver irritation also. Liver enzyme levels stable from previous.  I plan to give handouts on cholesterol, high fiber diet and liver function tests. If drinking alcohol I recommend decreasing intake. I recommend weight loss, exercise 150 minutes per week; dietary fiber 30 grams per day men; eat whole grains/fruits/vegetables; keep added sugars to less than 150 calories/9 teaspoons for men per American Heart Association; blood sugar, prostate, electrolytes, iron, kidney function, thyroid and complete blood count normal  Please let me know if you have further questions, Sincerely, Gerarda Fraction NP-C      Assessment & Plan:  A-annual physical, BMI 29, elevated LDL, elevated AST/ALT/GGT, proteinuria, ventral hernia, GERD, CAD in native artery, hypertension, history of colonic polyps, need for colon cancer screening, history of gout, paresthesias bilateral fingers, seasonal allergic rhinitis  P-next physical in 1 year.  tdap booster today; hold covid vaccination x 2 weeks then schedule first vaccination.  Continue mask wear, social distancing and frequent hand washing/sanitizing.  I recommend 150 minutes exercise per week and weight loss. Last colonoscopy 2012 had polyps removed and told to follow up in 5 years but then had chest pain and stent placed/put on blood thinners and has not returned as repeat stent 2019  then covid pandemic/blood thinners changed.  Patient to call his GI office to schedule follow up and talk with cardiologist also as coordination will be needed regarding his blood thinners and procedure.Schedule optometry appt overdue every 1-2 years; continue dental every 6 months.  Exitcare handouts on VIS Tdap and colon polyps printed and given to patient    Patient verbalized understanding information/instructions, agreed with plan of care and had no further questions at this time.   Colon cancer screening overdue history of 2 polyps removed 2012 due 2017 but postponed due to CAD/stent/brillinta and now plavix patient to coordinate with cardiology and gastroenterology    Decrease alcohol intake to two beers per day as intake greater than this can cause health problems.  Max 14 beers per week.  Do not stop cold [place] must taper use. Patient verbalized understanding information/instructions,  and had no further questions at this time.  Elevated AST/ALT/GGT.  Alcohol intake and BMI 29.  Encouraged weight loss to BMI less than 25.  Exercise 150 minutes per week. Exitcare handout on fatty liver disease, alcohol abuse and liver function tests printed and given to patient.   Patient verbalized understanding information/instructions, agreed with plan of care and had no further questions at this time.  BMI 29.69  Exercise, decrease non-nutritional calories e.g. alcohol/added sugars in diet, increase steps 10% time or distance per week until 10000 steps per day. Patient verbalized understanding  information/instructions, agreed with plan of care and had no further questions at this time.  Hypertension continue lisinopril hydrochlorothiazide 10/12.5mg  po daily and metoprolol XL  po daily.  Patient to notify us when he needs refills.  Continue current medications as directed. Consider low sodium/DASH diet and exercise program 150 minutes exercise/activity per week.  Recommended weight loss/weight maintenance  to BMI 20-25. Return to the clinic if any new symptoms/notify clinic staff if visual changes, frequent headache, chest pain or dyspnea on mild or  minimal exertion. Exitcare handout on managing hypertension and DASH diet printed and given to patient.  Patient verbalized agreement and understanding of treatment plan and had no further questions at this time. P2: Diet and Exercise specific for HTN  Hyperlipidemia continue atorvastatin  po daily Discussed lifestyle modification re diet to decrease white starches/sugars e.g. potatoes/bread increase omega 3 sources nuts, fish options add more fruits and vegetables and lean protein choices such as hummus or yogurt. Patient given exitcare handout on high cholesterol and high fiber diet printed and given to patient. Discussed weight loss. History of CAD and stent overdue annual follow up with cardiology.  Encouraged patient to schedule. Patient agreed with plan of care and verbalized understanding of information/instructions and had no further questions at this time.   Ventral hernia continue to monitor if enlarging or becoming painful/changes in bowel habits seek follow up/re-evaluation.  Discussed avoid breath holding with lifting and buddy lift items heavier than 50lbs.  Avoid constipation.  Patient verbalized understanding information/instructions, agreed with plan of care and had no further questions at this time.  Paresthesias bilateral fingers new this past year.   B12 and B6 level today  Decrease alcohol intake continue multivitamin for men daily  Could be vitamin deficiency due to alcohol intake discussed this with patient.  Follow up re-evaluation if worsening or new symptoms e.g. hand weakness/rashes/bruising/swelling or that paresthesias not transient but lasting longer duration.  Patient verbalized understanding information/instructions, agreed with plan of care and had no further questions at this time.  Gout continue allopurinol  po daily.   Avoid foods known to flare or cause gout symptoms or limit portion size.  Avoid dehydration and drink water to keep urine clear pale yellow voiding every 3 hours while awake.Patient verbalized understanding information/instructions, agreed with plan of care and had no further questions at this time.  Proteinuria has had twice previously during annual physicals than resolved.  Hypertension.  Will repeat in 1 year sooner if new urinary symptoms.  Avoid dehydration/NSAID use.  Decrease alcohol intake.  Increase water intake to keep urine pale yellow and clear voiding every 3 hours while awake. Exitcare handout on proteinuria printed and given to patient. Patient verbalized understanding information/instructions, agreed with plan of care and had no further questions at this time.  Patient may use normal saline nasal spray 2 sprays each nostril q2h wa as needed.  OTC antihistamine of choice claritin  po daily.  Avoid triggers if possible.  Shower prior to bedtime if exposed to triggers.  If allergic dust/dust mites recommend mattress/pillow covers/encasements; washing linens, vacuuming, sweeping, dusting weekly.  Call or return to clinic as needed if these symptoms worsen or fail to improve as anticipated.  Patient verbalized understanding of instructions, agreed with plan of care and had no further questions at this time.  P2:  Avoidance and hand washing.  Decreased high pitch on audiogram but also noted with cobblestoning and bilateral TMs with effusion probably contributing.  Discussed with patient to wear  hearing protection at home and work when around motorized equipment/machines.  Repeat audiogram in 1 year.  Consider testing during his nonallergy season/flare.  Patient verbalized understanding information/instructions, agreed with plan of care and had no further questions at this time.

## 2020-02-10 NOTE — Patient Instructions (Addendum)
https://www.cdc.gov/vaccines/hcp/vis/vis-statements/tdap.pdf">  Tdap (Tetanus, Diphtheria, Pertussis) Vaccine: What You Need to Know 1. Why get vaccinated? Tdap vaccine can prevent tetanus, diphtheria, and pertussis. Diphtheria and pertussis spread from person to person. Tetanus enters the body through cuts or wounds.  TETANUS (T) causes painful stiffening of the muscles. Tetanus can lead to serious health problems, including being unable to open the mouth, having trouble swallowing and breathing, or death.  DIPHTHERIA (D) can lead to difficulty breathing, heart failure, paralysis, or death.  PERTUSSIS (aP), also known as "whooping cough," can cause uncontrollable, violent coughing which makes it hard to breathe, eat, or drink. Pertussis can be extremely serious in babies and young children, causing pneumonia, convulsions, brain damage, or death. In teens and adults, it can cause weight loss, loss of bladder control, passing out, and rib fractures from severe coughing. 2. Tdap vaccine Tdap is only for children 7 years and older, adolescents, and adults.  Adolescents should receive a single dose of Tdap, preferably at age 53 or 35 years. Pregnant women should get a dose of Tdap during every pregnancy, to protect the newborn from pertussis. Infants are most at risk for severe, life-threatening complications from pertussis. Adults who have never received Tdap should get a dose of Tdap. Also, adults should receive a booster dose every 10 years, or earlier in the case of a severe and dirty wound or burn. Booster doses can be either Tdap or Td (a different vaccine that protects against tetanus and diphtheria but not pertussis). Tdap may be given at the same time as other vaccines. 3. Talk with your health care provider Tell your vaccine provider if the person getting the vaccine:  Has had an allergic reaction after a previous dose of any vaccine that protects against tetanus, diphtheria, or pertussis,  or has any severe, life-threatening allergies.  Has had a coma, decreased level of consciousness, or prolonged seizures within 7 days after a previous dose of any pertussis vaccine (DTP, DTaP, or Tdap).  Has seizures or another nervous system problem.  Has ever had Guillain-Barr Syndrome (also called GBS).  Has had severe pain or swelling after a previous dose of any vaccine that protects against tetanus or diphtheria. In some cases, your health care provider may decide to postpone Tdap vaccination to a future visit.  People with minor illnesses, such as a cold, may be vaccinated. People who are moderately or severely ill should usually wait until they recover before getting Tdap vaccine.  Your health care provider can give you more information. 4. Risks of a vaccine reaction  Pain, redness, or swelling where the shot was given, mild fever, headache, feeling tired, and nausea, vomiting, diarrhea, or stomachache sometimes happen after Tdap vaccine. People sometimes faint after medical procedures, including vaccination. Tell your provider if you feel dizzy or have vision changes or ringing in the ears.  As with any medicine, there is a very remote chance of a vaccine causing a severe allergic reaction, other serious injury, or death. 5. What if there is a serious problem? An allergic reaction could occur after the vaccinated person leaves the clinic. If you see signs of a severe allergic reaction (hives, swelling of the face and throat, difficulty breathing, a fast heartbeat, dizziness, or weakness), call 9-1-1 and get the person to the nearest hospital. For other signs that concern you, call your health care provider.  Adverse reactions should be reported to the Vaccine Adverse Event Reporting System (VAERS). Your health care provider will usually file this report,  or you can do it yourself. Visit the VAERS website at www.vaers.SamedayNews.es or call 313 212 3593. VAERS is only for reporting  reactions, and VAERS staff do not give medical advice. 6. The National Vaccine Injury Compensation Program The [employer] Vaccine Injury Compensation Program (VICP) is a federal program that was created to compensate people who may have been injured by certain vaccines. Visit the VICP website at GoldCloset.com.ee or call (618) 746-7551 to learn about the program and about filing a claim. There is a time limit to file a claim for compensation. 7. How can I learn more?  Ask your health care provider.  Call your local or state health department.  Contact the Centers for Disease Control and Prevention (CDC): ? Call 617-808-5199 (1-800-CDC-INFO) or ? Visit CDC's website at http://hunter.com/ Vaccine Information Statement Tdap (Tetanus, Diphtheria, Pertussis) Vaccine (02/11/2019) This information is not intended to replace advice given to you by your health care provider. Make sure you discuss any questions you have with your health care provider. Document Revised: 02/20/2019 Document Reviewed: 02/23/2019 Elsevier Patient Education  [place]. Proteinuria Proteinuria is when there is too much protein in the urine. Proteins are important for building muscles and bones. Proteins are also needed to fight infections, help the blood to clot, and keep body fluids in balance. Proteinuria may be mild and temporary, or it may be an early sign of kidney disease. The kidneys make urine. Healthy kidneys also keep substances like proteins from leaving the blood and ending up in the urine. What are the causes? This condition may be caused by damage to the kidneys or by temporary causes such as fever or stress. Proteinuria may happen when the kidneys are not working well. Healthy kidneys have filters (glomeruli) that keep proteins out of the urine. Proteinuria may mean that the glomeruli are damaged. The main causes of this type of damage are:  Diabetes.  High blood pressure. Other  causes of kidney damage can also cause proteinuria, such as:  Diseases of the immune system, such as lupus, rheumatoid arthritis, sarcoidosis, and Goodpasture syndrome.  Heart disease or heart failure.  Kidney infection.  Certain cancers, including kidney cancer, lymphoma, leukemia, and multiple myeloma.  Amyloidosis. This is a disease that causes abnormal proteins to build up in body tissues.  Reactions to certain medicines, such as NSAIDs.  Injuries or poisons (toxins).  High blood pressure that occurs during pregnancy (preeclampsia and eclampsia). Temporary proteinuria may result from conditions that put stress on the kidneys. These conditions usually do not cause kidney damage. They include:  Fever.  Exposure to cold or heat.  Emotional or physical stress.  Extreme exercise.  Standing for long periods of time. What increases the risk? You are more likely to develop this condition if you:  Have diabetes.  Have high blood pressure.  Have heart disease or heart failure.  Have an immune disease, cancer, or other disease that affects the kidneys.  Have a family history of kidney disease.  Are 1 years of age or older.  Are overweight.  Are of African American, American [place], Hispanic/Latino, or [place] descent.  Are pregnant.  Have an infection. What are the signs or symptoms? Mild proteinuria may not cause symptoms. As more proteins enter the urine, symptoms of kidney disease may develop, such as:  Foamy urine.  Swelling of the face, abdomen, hands, legs, or feet (edema).  Needing to urinate frequently.  Fatigue.  Difficulty sleeping.  Dry and itchy skin.  Nausea and vomiting.  Muscle  cramps.  Shortness of breath. How is this diagnosed? This condition may be diagnosed with a urine test. You may have this test as part of a routine physical exam or because you have symptoms of kidney disease or risk factors for kidney disease. You may  also have:  Blood tests to measure the level of a certain substance (creatinine) that increases with kidney disease.  Imaging tests of your kidney, such as a CT scan or an ultrasound, to look for signs of kidney damage. How is this treated? If your proteinuria is mild or temporary, treatment may not be needed for this condition. Your health care provider may show you how to monitor the level of protein in your urine at home. Identifying proteinuria early is important so that the cause of the condition can be treated. Treatment for this condition depends on the cause of your proteinuria. Treatment may include:  Making diet and lifestyle changes.  Getting blood pressure under control.  Getting blood sugar under control, if you have diabetes.  Managing any other medical conditions you have that affect your kidneys.  Giving birth, if you are pregnant.  Avoiding medicines that damage your kidneys. In severe cases, kidney disease may need to be treated with medicines or dialysis. Follow these instructions at home: Activity  Return to your normal activities as told by your health care provider. Ask your health care provider what activities are safe for you.  Ask your health care provider to recommend an exercise program. General instructions  Check your protein levels at home if directed by your health care provider.  Follow instructions from your health care provider about eating or drinking restrictions.  If you are overweight, ask your health care provider about diets that can help you get to a healthy weight.  Take over-the-counter and prescription medicines only as told by your health care provider.  Keep all follow-up visits as told by your health care provider. This is important. Contact a health care provider if:  You have new symptoms.  Your symptoms get worse or do not improve. Get help right away if you:  Have back pain.  Have diarrhea.  Vomit.  Have a  fever.  Have a rash. Summary  Proteinuria is when there is too much protein in the urine.  Proteinuria may be mild and temporary, or it may be an early sign of kidney disease.  This condition may be diagnosed with a urine test.  Treatment for this condition depends on the cause of your proteinuria.  Treatment may include diet and lifestyle changes, blood pressure and blood sugar management, and avoiding medicines that may damage the kidneys. If the proteinuria is severe, it may need to be treated with medicines or dialysis. This information is not intended to replace advice given to you by your health care provider. Make sure you discuss any questions you have with your health care provider. Document Revised: 06/16/2018 Document Reviewed: 06/16/2018 Elsevier Patient Education  [place]. Alcohol Abuse and Dependence Information, Adult Alcohol is a widely available drug. People drink alcohol in different amounts. People who drink alcohol very often and in large amounts often have problems during and after drinking. They may develop what is called an alcohol use disorder. There are two main types of alcohol use disorders:  Alcohol abuse. This is when you use alcohol too much or too often. You may use alcohol to make yourself feel happy or to reduce stress. You may have a hard time setting a  limit on the amount you drink.  Alcohol dependence. This is when you use alcohol consistently for a period of time, and your body changes as a result. This can make it hard to stop drinking because you may start to feel sick or feel different when you do not use alcohol. These symptoms are known as withdrawal. How can alcohol abuse and dependence affect me? Alcohol abuse and dependence can have a negative effect on your life. Drinking too much can lead to addiction. You may feel like you need alcohol to function normally. You may drink alcohol before work in the morning, during the day, or as soon  as you get home from work in the evening. These actions can result in:  Poor work performance.  Job loss.  Financial problems.  Car crashes or criminal charges from driving after drinking alcohol.  Problems in your relationships with friends and family.  Losing the trust and respect of coworkers, friends, and family. Drinking heavily over a long period of time can permanently damage your body and brain, and can cause lifelong health issues, such as:  Damage to your liver or pancreas.  Heart problems, high blood pressure, or stroke.  Certain cancers.  Decreased ability to fight infections.  Brain or nerve damage.  Depression.  Early (premature) death. If you are careless or you crave alcohol, it is easy to drink more than your body can handle (overdose). Alcohol overdose is a serious situation that requires hospitalization. It may lead to permanent injuries or death. What can increase my risk?  Having a family history of alcohol abuse.  Having depression or other mental health conditions.  Beginning to drink at an early age.  Binge drinking often.  Experiencing trauma, stress, and an unstable home life during childhood.  Spending time with people who drink often. What actions can I take to prevent or manage alcohol abuse and dependence?  Do not drink alcohol if: ? Your health care provider tells you not to drink. ? You are pregnant, may be pregnant, or are planning to become pregnant.  If you drink alcohol: ? Limit how much you use to:  0-1 drink a day for women.  0-2 drinks a day for men. ? Be aware of how much alcohol is in your drink. In the [place] one drink equals one 12 oz bottle of beer (355 mL), one 5 oz glass of wine (148 mL), or one 1 oz glass of hard liquor (44 mL).  Stop drinking if you have been drinking too much. This can be very hard to do if you are used to abusing alcohol. If you begin to have withdrawal symptoms, talk with your health care  provider or a person that you trust. These symptoms may include anxiety, shaky hands, headache, nausea, sweating, or not being able to sleep.  Choose to drink nonalcoholic beverages in social gatherings and places where there may be alcohol. Activity  Spend more time on activities that you enjoy that do not involve alcohol, like hobbies or exercise.  Find healthy ways to cope with stress, such as exercise, meditation, or spending time with people you care about. General information  Talk to your family, coworkers, and friends about supporting you in your efforts to stop drinking. If they drink, ask them not to drink around you. Spend more time with people who do not drink alcohol.  If you think that you have an alcohol dependency problem: ? Tell friends or family about your concerns. ? Talk with  your health care provider or another health professional about where to get help. ? Work with a Transport planner and a Regulatory affairs officer. ? Consider joining a support group for people who struggle with alcohol abuse and dependence. Where to find support   Your health care provider.  SMART Recovery: www.smartrecovery.org Therapy and support groups  Local treatment centers or chemical dependency counselors.  Local AA groups in your community: NicTax.com.pt Where to find more information  Centers for Disease Control and Prevention: http://www.wolf.info/  National Institute on Alcohol Abuse and Alcoholism: http://www.bradshaw.com/  Alcoholics Anonymous (AA): NicTax.com.pt Contact a health care provider if:  You drank more or for longer than you intended on more than one occasion.  You tried to stop drinking or to cut back on how much you drink, but you were not able to.  You often drink to the point of vomiting or passing out.  You want to drink so badly that you cannot think about anything else.  You have problems in your life due to drinking, but you continue to drink.  You keep drinking even  though you feel anxious, depressed, or have experienced memory loss.  You have stopped doing the things you used to enjoy in order to drink.  You have to drink more than you used to in order to get the effect you want.  You experience anxiety, sweating, nausea, shakiness, and trouble sleeping when you try to stop drinking. Get help right away if:  You have thoughts about hurting yourself or others.  You have serious withdrawal symptoms, including: ? Confusion. ? Racing heart. ? High blood pressure. ? Fever. If you ever feel like you may hurt yourself or others, or have thoughts about taking your own life, get help right away. You can go to your nearest emergency department or call:  Your local emergency services (911 in the [place]  A suicide crisis helpline, such as the [facility] at 209-795-9137. This is open 24 hours a day. Summary  Alcohol abuse and dependence can have a negative effect on your life. Drinking too much or too often can lead to addiction.  If you drink alcohol, limit how much you use.  If you are having trouble keeping your drinking under control, find ways to change your behavior. Hobbies, calming activities, exercise, or support groups can help.  If you feel you need help with changing your drinking habits, talk with your health care provider, a good friend, or a therapist, or go to an [place] group. This information is not intended to replace advice given to you by your health care provider. Make sure you discuss any questions you have with your health care provider. Document Revised: 02/17/2019 Document Reviewed: 01/06/2019 Elsevier Patient Education  [place]. High-Fiber Diet Fiber, also called dietary fiber, is a type of carbohydrate that is found in fruits, vegetables, whole grains, and beans. A high-fiber diet can have many health benefits. Your health care provider may recommend a high-fiber diet to help:  Prevent  constipation. Fiber can make your bowel movements more regular.  Lower your cholesterol.  Relieve the following conditions: ? Swelling of veins in the anus (hemorrhoids). ? Swelling and irritation (inflammation) of specific areas of the digestive tract (uncomplicated diverticulosis). ? A problem of the large intestine (colon) that sometimes causes pain and diarrhea (irritable bowel syndrome, IBS).  Prevent overeating as part of a weight-loss plan.  Prevent heart disease, type 2 diabetes, and certain cancers. What is my plan? The  recommended daily fiber intake in grams (g) includes:  38 g for men age 72 or younger.  30 g for men over age 21.  71 g for women age 25 or younger.  21 g for women over age 1. You can get the recommended daily intake of dietary fiber by:  Eating a variety of fruits, vegetables, grains, and beans.  Taking a fiber supplement, if it is not possible to get enough fiber through your diet. What do I need to know about a high-fiber diet?  It is better to get fiber through food sources rather than from fiber supplements. There is not a lot of research about how effective supplements are.  Always check the fiber content on the nutrition facts label of any prepackaged food. Look for foods that contain 5 g of fiber or more per serving.  Talk with a diet and nutrition specialist (dietitian) if you have questions about specific foods that are recommended or not recommended for your medical condition, especially if those foods are not listed below.  Gradually increase how much fiber you consume. If you increase your intake of dietary fiber too quickly, you may have bloating, cramping, or gas.  Drink plenty of water. Water helps you to digest fiber. What are tips for following this plan?  Eat a wide variety of high-fiber foods.  Make sure that half of the grains that you eat each day are whole grains.  Eat breads and cereals that are made with whole-grain flour  instead of refined flour or white flour.  Eat brown rice, bulgur wheat, or millet instead of white rice.  Start the day with a breakfast that is high in fiber, such as a cereal that contains 5 g of fiber or more per serving.  Use beans in place of meat in soups, salads, and pasta dishes.  Eat high-fiber snacks, such as berries, raw vegetables, nuts, and popcorn.  Choose whole fruits and vegetables instead of processed forms like juice or sauce. What foods can I eat?  Fruits Berries. Pears. Apples. Oranges. Avocado. Prunes and raisins. Dried figs. Vegetables Sweet potatoes. Spinach. Kale. Artichokes. Cabbage. Broccoli. Cauliflower. Green peas. Carrots. Squash. Grains Whole-grain breads. Multigrain cereal. Oats and oatmeal. Brown rice. Barley. Bulgur wheat. [place]. Quinoa. Bran muffins. Popcorn. Rye wafer crackers. Meats and other proteins Navy, kidney, and pinto beans. Soybeans. Split peas. Lentils. Nuts and seeds. Dairy Fiber-fortified yogurt. Beverages Fiber-fortified soy milk. Fiber-fortified orange juice. Other foods Fiber bars. The items listed above may not be a complete list of recommended foods and beverages. Contact a dietitian for more options. What foods are not recommended? Fruits Fruit juice. Cooked, strained fruit. Vegetables Fried potatoes. Canned vegetables. Well-cooked vegetables. Grains White bread. Pasta made with refined flour. White rice. Meats and other proteins Fatty cuts of meat. Fried chicken or fried fish. Dairy Milk. Yogurt. Cream cheese. Sour cream. Fats and oils Butters. Beverages Soft drinks. Other foods Cakes and pastries. The items listed above may not be a complete list of foods and beverages to avoid. Contact a dietitian for more information. Summary  Fiber is a type of carbohydrate. It is found in fruits, vegetables, whole grains, and beans.  There are many health benefits of eating a high-fiber diet, such as preventing constipation,  lowering blood cholesterol, helping with weight loss, and reducing your risk of heart disease, diabetes, and certain cancers.  Gradually increase your intake of fiber. Increasing too fast can result in cramping, bloating, and gas. Drink plenty of water while  you increase your fiber.  The best sources of fiber include whole fruits and vegetables, whole grains, nuts, seeds, and beans. This information is not intended to replace advice given to you by your health care provider. Make sure you discuss any questions you have with your health care provider. Document Revised: 09/02/2017 Document Reviewed: 09/02/2017 Elsevier Patient Education  2020 [place]. Liver Function Tests Why am I having this test? Liver function tests are done to see how well your liver is working. The proteins and enzymes measured in the test can alert your health care provider to inflammation, damage, or disease in your liver. It is common to have liver function tests:  When you are taking certain medicines.  If you have liver disease.  If you drink a lot of alcohol.  When you are not feeling well.  When you have other conditions that may affect your liver.  During annual physical exams.  If you have symptoms such as yellowing of the skin (jaundice), abdominal pain, or nausea and vomiting. What is being tested? These tests measure various substances in your blood. This may include:  Alanine transaminase (ALT). This is an enzyme in the liver.  Aspartate transaminase (AST). This is an enzyme in the liver, heart, and muscles.  Alkaline phosphatase (ALP). This is a protein in the liver, bile ducts, bone, and other body tissues.  Total bilirubin. This is a yellow pigment in bile.  Albumin. This is a protein in the liver.  Prothrombin time and international normalized ratio (PT and INR). PT measures the time it takes for your blood to clot. INR is a calculation of blood clotting time based on your PT result. It  is also calculated based on normal ranges defined by the lab that processed your test.  Total protein. This includes two proteins, albumin and globulin, found in the blood. What kind of sample is taken?  A blood sample is required for this test. It is usually collected by inserting a needle into a blood vessel. How do I prepare for this test? How you prepare will depend on which tests are being done and the reason for doing them. You may need to:  Avoid eating for 4-6 hours before the test, or as told by your health care provider.  Stop taking certain medicines before your blood test, as told by your health care provider. Tell a health care provider about:  All medicines you are taking, including vitamins, herbs, eye drops, creams, and over-the-counter medicines.  Any medical conditions you have.  Whether you are pregnant or may be pregnant. How are the results reported? Your test results will be reported as values. Your health care provider will compare your results to normal ranges that were established after testing a large group of people (reference ranges). Reference ranges may vary among labs and hospitals. For the substances measured in liver function tests, common reference ranges are: ALT  Infant: 10-40 international units/L.  Child or adult: 4-36 international units/L at 37C or 4-36 units/L (SI units).  Reference ranges may be higher for older adults. AST  Newborn 7-41 days old: 35-140 units/L.  Child younger than 46 years old: 15-60 units/L.  91-74 years old: 15-50 units/L.  26-28 years old: 10-50 units/L.  61-53 years old: 10-40 units/L.  Adult: 0-35 units/L or 0-0.58 microkatals/L (SI units).  Reference ranges may be higher for older adults. ALP  Child younger than 20 years old: 85-235 units/L.  56-81 years old: 65-210 units/L.  9-15 years  old: 60-300 units/L.  72-10 years old: 30-200 units/L.  Adult: 30-120 units/L or 0.5-2.0 microkatals/L (SI  units).  Reference ranges may be higher for older adults. Total bilirubin  Newborn: 1.0-12.0 mg/dL or 17.1-205 micromoles/L (SI units).  Child or adult: 0.3-1.0 mg/dL or 5.1-17 micromoles/L. Albumin  Premature infant: 3.0-4.2 g/dL.  Newborn: 3.5-5.4 g/dL.  Infant: 4.4-5.4 g/dL.  Child: 4.0-5.9 g/dL.  Adult: 3.5-5.0 g/dL or 35-50 g/L (SI units). PT  11.0-12.5 seconds; 85%-100%. INR  0.8-1.1. Total protein  Premature infant: 4.2-7.6 g/dL.  Newborn: 4.6-7.4 g/dL.  Infant: 6.0-6.7 g/dL.  Child: 6.2-8.0 g/dL.  Adult: 6.4-8.3 g/dL or 64-83 g/L (SI units). What do the results mean? Results that are within the reference ranges are considered normal. For each substance measured, results outside the reference range can indicate various health issues. ALT  Levels above the normal range may indicate liver disease. AST  Levels above the normal range may indicate liver disease. Sometimes levels also increase after burns, surgery, heart attack, muscle damage, or seizure. ALP  Levels above the normal range may be seen in biliary obstruction, liver diseases, bone disease, thyroid disease, tumors, fractures, leukemia, lymphoma, or several other conditions. People with blood type O or B may show higher levels after a fatty meal.  Levels below the normal range may indicate bone and teeth conditions, malnutrition, protein deficiency, or Wilson's disease. Total bilirubin  Levels above the normal range may indicate problems with the liver, gallbladder, or bile ducts. Albumin  Levels above the normal range may indicate dehydration. They may also be caused by a diet that is high in protein.  Levels below the normal range may indicate kidney disease, liver disease, or malabsorption of nutrients. PT and INR  Levels above the normal range mean that your blood is clotting slower than normal. This may be due to blood disorders, liver disorders, or low levels of vitamin K. Total  protein  Levels above the normal range may be due to infection or other diseases.  Levels below the normal range may be due to an immune system disorder, bleeding, burns, kidney disorder, liver disease, trouble absorbing or getting nutrients, or other conditions that affect the intestines. Talk with your health care provider about what your results mean. Questions to ask your health care provider Ask your health care provider, or the department that is doing the test:  When will my results be ready?  How will I get my results?  What are my treatment options?  What other tests do I need?  What are my next steps? Summary  Liver function tests are done to see how well your liver is working.  These tests measure various proteins and enzymes in your blood. The results can alert your health care provider to inflammation, damage, or disease in your liver.  Talk with your health care provider about what your results mean. This information is not intended to replace advice given to you by your health care provider. Make sure you discuss any questions you have with your health care provider. Document Revised: 06/18/2018 Document Reviewed: 08/13/2017 Elsevier Patient Education  [place]. Fatty Liver Disease  Fatty liver disease occurs when too much fat has built up in your liver cells. Fatty liver disease is also called hepatic steatosis or steatohepatitis. The liver removes harmful substances from your bloodstream and produces fluids that your body needs. It also helps your body use and store energy from the food you eat. In many cases, fatty liver disease does not  cause symptoms or problems. It is often diagnosed when tests are being done for other reasons. However, over time, fatty liver can cause inflammation that may lead to more serious liver problems, such as scarring of the liver (cirrhosis) and liver failure. Fatty liver is associated with insulin resistance, increased body  fat, high blood pressure (hypertension), and high cholesterol. These are features of metabolic syndrome and increase your risk for stroke, diabetes, and heart disease. What are the causes? This condition may be caused by:  Drinking too much alcohol.  Poor nutrition.  Obesity.  Cushing's syndrome.  Diabetes.  High cholesterol.  Certain drugs.  Poisons.  Some viral infections.  Pregnancy. What increases the risk? You are more likely to develop this condition if you:  Abuse alcohol.  Are overweight.  Have diabetes.  Have hepatitis.  Have a high triglyceride level.  Are pregnant. What are the signs or symptoms? Fatty liver disease often does not cause symptoms. If symptoms do develop, they can include:  Fatigue.  Weakness.  Weight loss.  Confusion.  Abdominal pain.  Nausea and vomiting.  Yellowing of your skin and the white parts of your eyes (jaundice).  Itchy skin. How is this diagnosed? This condition may be diagnosed by:  A physical exam and medical history.  Blood tests.  Imaging tests, such as an ultrasound, CT scan, or MRI.  A liver biopsy. A small sample of liver tissue is removed using a needle. The sample is then looked at under a microscope. How is this treated? Fatty liver disease is often caused by other health conditions. Treatment for fatty liver may involve medicines and lifestyle changes to manage conditions such as:  Alcoholism.  High cholesterol.  Diabetes.  Being overweight or obese. Follow these instructions at home:   Do not drink alcohol. If you have trouble quitting, ask your health care provider how to safely quit with the help of medicine or a supervised program. This is important to keep your condition from getting worse.  Eat a healthy diet as told by your health care provider. Ask your health care provider about working with a diet and nutrition specialist (dietitian) to develop an eating plan.  Exercise  regularly. This can help you lose weight and control your cholesterol and diabetes. Talk to your health care provider about an exercise plan and which activities are best for you.  Take over-the-counter and prescription medicines only as told by your health care provider.  Keep all follow-up visits as told by your health care provider. This is important. Contact a health care provider if: You have trouble controlling your:  Blood sugar. This is especially important if you have diabetes.  Cholesterol.  Drinking of alcohol. Get help right away if:  You have abdominal pain.  You have jaundice.  You have nausea and vomiting.  You vomit blood or material that looks like coffee grounds.  You have stools that are black, tar-like, or bloody. Summary  Fatty liver disease develops when too much fat builds up in the cells of your liver.  Fatty liver disease often causes no symptoms or problems. However, over time, fatty liver can cause inflammation that may lead to more serious liver problems, such as scarring of the liver (cirrhosis).  You are more likely to develop this condition if you abuse alcohol, are pregnant, are overweight, have diabetes, have hepatitis, or have high triglyceride levels.  Contact your health care provider if you have trouble controlling your weight, blood sugar, cholesterol, or drinking  of alcohol. This information is not intended to replace advice given to you by your health care provider. Make sure you discuss any questions you have with your health care provider. Document Revised: 10/11/2017 Document Reviewed: 08/07/2017 Elsevier Patient Education  [place] DASH stands for "Dietary Approaches to Stop Hypertension." The DASH eating plan is a healthy eating plan that has been shown to reduce high blood pressure (hypertension). It may also reduce your risk for type 2 diabetes, heart disease, and stroke. The DASH eating plan may also help  with weight loss. What are tips for following this plan?  General guidelines  Avoid eating more than 2,300 mg (milligrams) of salt (sodium) a day. If you have hypertension, you may need to reduce your sodium intake to 1,500 mg a day.  Limit alcohol intake to no more than 1 drink a day for nonpregnant women and 2 drinks a day for men. One drink equals 12 oz of beer, 5 oz of wine, or 1 oz of hard liquor.  Work with your health care provider to maintain a healthy body weight or to lose weight. Ask what an ideal weight is for you.  Get at least 30 minutes of exercise that causes your heart to beat faster (aerobic exercise) most days of the week. Activities may include walking, swimming, or biking.  Work with your health care provider or diet and nutrition specialist (dietitian) to adjust your eating plan to your individual calorie needs. Reading food labels   Check food labels for the amount of sodium per serving. Choose foods with less than 5 percent of the Daily Value of sodium. Generally, foods with less than 300 mg of sodium per serving fit into this eating plan.  To find whole grains, look for the word "whole" as the first word in the ingredient list. Shopping  Buy products labeled as "low-sodium" or "no salt added."  Buy fresh foods. Avoid canned foods and premade or frozen meals. Cooking  Avoid adding salt when cooking. Use salt-free seasonings or herbs instead of table salt or sea salt. Check with your health care provider or pharmacist before using salt substitutes.  Do not fry foods. Cook foods using healthy methods such as baking, boiling, grilling, and broiling instead.  Cook with heart-healthy oils, such as olive, canola, soybean, or sunflower oil. Meal planning  Eat a balanced diet that includes: ? 5 or more servings of fruits and vegetables each day. At each meal, try to fill half of your plate with fruits and vegetables. ? Up to 6-8 servings of whole grains each  day. ? Less than 6 oz of lean meat, poultry, or fish each day. A 3-oz serving of meat is about the same size as a deck of cards. One egg equals 1 oz. ? 2 servings of low-fat dairy each day. ? A serving of nuts, seeds, or beans 5 times each week. ? Heart-healthy fats. Healthy fats called Omega-3 fatty acids are found in foods such as flaxseeds and coldwater fish, like sardines, salmon, and mackerel.  Limit how much you eat of the following: ? Canned or prepackaged foods. ? Food that is high in trans fat, such as fried foods. ? Food that is high in saturated fat, such as fatty meat. ? Sweets, desserts, sugary drinks, and other foods with added sugar. ? Full-fat dairy products.  Do not salt foods before eating.  Try to eat at least 2 vegetarian meals each week.  Eat more home-cooked food  and less restaurant, buffet, and fast food.  When eating at a restaurant, ask that your food be prepared with less salt or no salt, if possible. What foods are recommended? The items listed may not be a complete list. Talk with your dietitian about what dietary choices are best for you. Grains Whole-grain or whole-wheat bread. Whole-grain or whole-wheat pasta. Brown rice. [place]. Bulgur. Whole-grain and low-sodium cereals. Pita bread. Low-fat, low-sodium crackers. Whole-wheat flour tortillas. Vegetables Fresh or frozen vegetables (raw, steamed, roasted, or grilled). Low-sodium or reduced-sodium tomato and vegetable juice. Low-sodium or reduced-sodium tomato sauce and tomato paste. Low-sodium or reduced-sodium canned vegetables. Fruits All fresh, dried, or frozen fruit. Canned fruit in natural juice (without added sugar). Meat and other protein foods Skinless chicken or [place]. Ground chicken or [place]. Pork with fat trimmed off. Fish and seafood. Egg whites. Dried beans, peas, or lentils. Unsalted nuts, nut butters, and seeds. Unsalted canned beans. Lean cuts of beef with fat trimmed off.  Low-sodium, lean deli meat. Dairy Low-fat (1%) or fat-free (skim) milk. Fat-free, low-fat, or reduced-fat cheeses. Nonfat, low-sodium ricotta or cottage cheese. Low-fat or nonfat yogurt. Low-fat, low-sodium cheese. Fats and oils Soft margarine without trans fats. Vegetable oil. Low-fat, reduced-fat, or light mayonnaise and salad dressings (reduced-sodium). Canola, safflower, olive, soybean, and sunflower oils. Avocado. Seasoning and other foods Herbs. Spices. Seasoning mixes without salt. Unsalted popcorn and pretzels. Fat-free sweets. What foods are not recommended? The items listed may not be a complete list. Talk with your dietitian about what dietary choices are best for you. Grains Baked goods made with fat, such as croissants, muffins, or some breads. Dry pasta or rice meal packs. Vegetables Creamed or fried vegetables. Vegetables in a cheese sauce. Regular canned vegetables (not low-sodium or reduced-sodium). Regular canned tomato sauce and paste (not low-sodium or reduced-sodium). Regular tomato and vegetable juice (not low-sodium or reduced-sodium). Angie Fava. Olives. Fruits Canned fruit in a light or heavy syrup. Fried fruit. Fruit in cream or butter sauce. Meat and other protein foods Fatty cuts of meat. Ribs. Fried meat. Berniece Salines. Sausage. Bologna and other processed lunch meats. Salami. Fatback. Hotdogs. Bratwurst. Salted nuts and seeds. Canned beans with added salt. Canned or smoked fish. Whole eggs or egg yolks. Chicken or [place] with skin. Dairy Whole or 2% milk, cream, and half-and-half. Whole or full-fat cream cheese. Whole-fat or sweetened yogurt. Full-fat cheese. Nondairy creamers. Whipped toppings. Processed cheese and cheese spreads. Fats and oils Butter. Stick margarine. Lard. Shortening. Ghee. Bacon fat. Tropical oils, such as coconut, palm kernel, or palm oil. Seasoning and other foods Salted popcorn and pretzels. Onion salt, garlic salt, seasoned salt, table salt, and sea  salt. Worcestershire sauce. Tartar sauce. Barbecue sauce. Teriyaki sauce. Soy sauce, including reduced-sodium. Steak sauce. Canned and packaged gravies. Fish sauce. Oyster sauce. Cocktail sauce. Horseradish that you find on the shelf. Ketchup. Mustard. Meat flavorings and tenderizers. Bouillon cubes. Hot sauce and Tabasco sauce. Premade or packaged marinades. Premade or packaged taco seasonings. Relishes. Regular salad dressings. Where to find more information:  National Heart, Lung, and [place]: https://wilson-eaton.com/  American Heart Association: www.heart.org Summary  The DASH eating plan is a healthy eating plan that has been shown to reduce high blood pressure (hypertension). It may also reduce your risk for type 2 diabetes, heart disease, and stroke.  With the DASH eating plan, you should limit salt (sodium) intake to 2,300 mg a day. If you have hypertension, you may need to reduce your sodium intake to 1,500 mg a  day.  When on the DASH eating plan, aim to eat more fresh fruits and vegetables, whole grains, lean proteins, low-fat dairy, and heart-healthy fats.  Work with your health care provider or diet and nutrition specialist (dietitian) to adjust your eating plan to your individual calorie needs. This information is not intended to replace advice given to you by your health care provider. Make sure you discuss any questions you have with your health care provider. Document Revised: 10/11/2017 Document Reviewed: 10/22/2016 Elsevier Patient Education  2020 [employer]. Managing Your Hypertension Hypertension is commonly called high blood pressure. This is when the force of your blood pressing against the walls of your arteries is too strong. Arteries are blood vessels that carry blood from your heart throughout your body. Hypertension forces the heart to work harder to pump blood, and may cause the arteries to become narrow or stiff. Having untreated or uncontrolled hypertension can  cause heart attack, stroke, kidney disease, and other problems. What are blood pressure readings? A blood pressure reading consists of a higher number over a lower number. Ideally, your blood pressure should be below 120/80. The first ("top") number is called the systolic pressure. It is a measure of the pressure in your arteries as your heart beats. The second ("bottom") number is called the diastolic pressure. It is a measure of the pressure in your arteries as the heart relaxes. What does my blood pressure reading mean? Blood pressure is classified into four stages. Based on your blood pressure reading, your health care provider may use the following stages to determine what type of treatment you need, if any. Systolic pressure and diastolic pressure are measured in a unit called mm Hg. Normal  Systolic pressure: below 992.  Diastolic pressure: below 80. Elevated  Systolic pressure: 426-834.  Diastolic pressure: below 80. Hypertension stage 1  Systolic pressure: 196-222.  Diastolic pressure: 97-98. Hypertension stage 2  Systolic pressure: 921 or above.  Diastolic pressure: 90 or above. What health risks are associated with hypertension? Managing your hypertension is an important responsibility. Uncontrolled hypertension can lead to:  A heart attack.  A stroke.  A weakened blood vessel (aneurysm).  Heart failure.  Kidney damage.  Eye damage.  Metabolic syndrome.  Memory and concentration problems. What changes can I make to manage my hypertension? Hypertension can be managed by making lifestyle changes and possibly by taking medicines. Your health care provider will help you make a plan to bring your blood pressure within a normal range. Eating and drinking   Eat a diet that is high in fiber and potassium, and low in salt (sodium), added sugar, and fat. An example eating plan is called the DASH (Dietary Approaches to Stop Hypertension) diet. To eat this way: ? Eat  plenty of fresh fruits and vegetables. Try to fill half of your plate at each meal with fruits and vegetables. ? Eat whole grains, such as whole wheat pasta, brown rice, or whole grain bread. Fill about one quarter of your plate with whole grains. ? Eat low-fat diary products. ? Avoid fatty cuts of meat, processed or cured meats, and poultry with skin. Fill about one quarter of your plate with lean proteins such as fish, chicken without skin, beans, eggs, and tofu. ? Avoid premade and processed foods. These tend to be higher in sodium, added sugar, and fat.  Reduce your daily sodium intake. Most people with hypertension should eat less than 1,500 mg of sodium a day.  Limit alcohol intake to no  more than 1 drink a day for nonpregnant women and 2 drinks a day for men. One drink equals 12 oz of beer, 5 oz of wine, or 1 oz of hard liquor. Lifestyle  Work with your health care provider to maintain a healthy body weight, or to lose weight. Ask what an ideal weight is for you.  Get at least 30 minutes of exercise that causes your heart to beat faster (aerobic exercise) most days of the week. Activities may include walking, swimming, or biking.  Include exercise to strengthen your muscles (resistance exercise), such as weight lifting, as part of your weekly exercise routine. Try to do these types of exercises for 30 minutes at least 3 days a week.  Do not use any products that contain nicotine or tobacco, such as cigarettes and e-cigarettes. If you need help quitting, ask your health care provider.  Control any long-term (chronic) conditions you have, such as high cholesterol or diabetes. Monitoring  Monitor your blood pressure at home as told by your health care provider. Your personal target blood pressure may vary depending on your medical conditions, your age, and other factors.  Have your blood pressure checked regularly, as often as told by your health care provider. Working with your health  care provider  Review all the medicines you take with your health care provider because there may be side effects or interactions.  Talk with your health care provider about your diet, exercise habits, and other lifestyle factors that may be contributing to hypertension.  Visit your health care provider regularly. Your health care provider can help you create and adjust your plan for managing hypertension. Will I need medicine to control my blood pressure? Your health care provider may prescribe medicine if lifestyle changes are not enough to get your blood pressure under control, and if:  Your systolic blood pressure is 130 or higher.  Your diastolic blood pressure is 80 or higher. Take medicines only as told by your health care provider. Follow the directions carefully. Blood pressure medicines must be taken as prescribed. The medicine does not work as well when you skip doses. Skipping doses also puts you at risk for problems. Contact a health care provider if:  You think you are having a reaction to medicines you have taken.  You have repeated (recurrent) headaches.  You feel dizzy.  You have swelling in your ankles.  You have trouble with your vision. Get help right away if:  You develop a severe headache or confusion.  You have unusual weakness or numbness, or you feel faint.  You have severe pain in your chest or abdomen.  You vomit repeatedly.  You have trouble breathing. Summary  Hypertension is when the force of blood pumping through your arteries is too strong. If this condition is not controlled, it may put you at risk for serious complications.  Your personal target blood pressure may vary depending on your medical conditions, your age, and other factors. For most people, a normal blood pressure is less than 120/80.  Hypertension is managed by lifestyle changes, medicines, or both. Lifestyle changes include weight loss, eating a healthy, low-sodium diet,  exercising more, and limiting alcohol. This information is not intended to replace advice given to you by your health care provider. Make sure you discuss any questions you have with your health care provider. Document Revised: 02/20/2019 Document Reviewed: 09/26/2016 Elsevier Patient Education  [place]. High Cholesterol  High cholesterol is a condition in which the blood  has high levels of a white, waxy, fat-like substance (cholesterol). The human body needs small amounts of cholesterol. The liver makes all the cholesterol that the body needs. Extra (excess) cholesterol comes from the food that we eat. Cholesterol is carried from the liver by the blood through the blood vessels. If you have high cholesterol, deposits (plaques) may build up on the walls of your blood vessels (arteries). Plaques make the arteries narrower and stiffer. Cholesterol plaques increase your risk for heart attack and stroke. Work with your health care provider to keep your cholesterol levels in a healthy range. What increases the risk? This condition is more likely to develop in people who:  Eat foods that are high in animal fat (saturated fat) or cholesterol.  Are overweight.  Are not getting enough exercise.  Have a family history of high cholesterol. What are the signs or symptoms? There are no symptoms of this condition. How is this diagnosed? This condition may be diagnosed from the results of a blood test.  If you are older than age 60, your health care provider may check your cholesterol every 4-6 years.  You may be checked more often if you already have high cholesterol or other risk factors for heart disease. The blood test for cholesterol measures:  "Bad" cholesterol (LDL cholesterol). This is the main type of cholesterol that causes heart disease. The desired level for LDL is less than 100.  "Good" cholesterol (HDL cholesterol). This type helps to protect against heart disease by cleaning  the arteries and carrying the LDL away. The desired level for HDL is 60 or higher.  Triglycerides. These are fats that the body can store or burn for energy. The desired number for triglycerides is lower than 150.  Total cholesterol. This is a measure of the total amount of cholesterol in your blood, including LDL cholesterol, HDL cholesterol, and triglycerides. A healthy number is less than 200. How is this treated? This condition is treated with diet changes, lifestyle changes, and medicines. Diet changes  This may include eating more whole grains, fruits, vegetables, nuts, and fish.  This may also include cutting back on red meat and foods that have a lot of added sugar. Lifestyle changes  Changes may include getting at least 40 minutes of aerobic exercise 3 times a week. Aerobic exercises include walking, biking, and swimming. Aerobic exercise along with a healthy diet can help you maintain a healthy weight.  Changes may also include quitting smoking. Medicines  Medicines are usually given if diet and lifestyle changes have failed to reduce your cholesterol to healthy levels.  Your health care provider may prescribe a statin medicine. Statin medicines have been shown to reduce cholesterol, which can reduce the risk of heart disease. Follow these instructions at home: Eating and drinking If told by your health care provider:  Eat chicken (without skin), fish, veal, shellfish, ground [place] breast, and round or loin cuts of red meat.  Do not eat fried foods or fatty meats, such as hot dogs and salami.  Eat plenty of fruits, such as apples.  Eat plenty of vegetables, such as broccoli, potatoes, and carrots.  Eat beans, peas, and lentils.  Eat grains such as barley, rice, couscous, and bulgur wheat.  Eat pasta without cream sauces.  Use skim or nonfat milk, and eat low-fat or nonfat yogurt and cheeses.  Do not eat or drink whole milk, cream, ice cream, egg yolks, or hard  cheeses.  Do not eat stick margarine or tub  margarines that contain trans fats (also called partially hydrogenated oils).  Do not eat saturated tropical oils, such as coconut oil and palm oil.  Do not eat cakes, cookies, crackers, or other baked goods that contain trans fats.  General instructions  Exercise as directed by your health care provider. Increase your activity level with activities such as gardening, walking, and taking the stairs.  Take over-the-counter and prescription medicines only as told by your health care provider.  Do not use any products that contain nicotine or tobacco, such as cigarettes and e-cigarettes. If you need help quitting, ask your health care provider.  Keep all follow-up visits as told by your health care provider. This is important. Contact a health care provider if:  You are struggling to maintain a healthy diet or weight.  You need help to start on an exercise program.  You need help to stop smoking. Get help right away if:  You have chest pain.  You have trouble breathing. This information is not intended to replace advice given to you by your health care provider. Make sure you discuss any questions you have with your health care provider. Document Revised: 11/01/2017 Document Reviewed: 04/28/2016 Elsevier Patient Education  [employer] [employer]. Colon Polyps  Polyps are tissue growths inside the body. Polyps can grow in many places, including the large intestine (colon). A polyp may be a round bump or a mushroom-shaped growth. You could have one polyp or several. Most colon polyps are noncancerous (benign). However, some colon polyps can become cancerous over time. Finding and removing the polyps early can help prevent this. What are the causes? The exact cause of colon polyps is not known. What increases the risk? You are more likely to develop this condition if you:  Have a family history of colon cancer or colon polyps.  Are older  than 64 or older than 45 if you are African American.  Have inflammatory bowel disease, such as ulcerative colitis or Crohn's disease.  Have certain hereditary conditions, such as: ? Familial adenomatous polyposis. ? Lynch syndrome. ? Turcot syndrome. ? Peutz-Jeghers syndrome.  Are overweight.  Smoke cigarettes.  Do not get enough exercise.  Drink too much alcohol.  Eat a diet that is high in fat and red meat and low in fiber.  Had childhood cancer that was treated with abdominal radiation. What are the signs or symptoms? Most polyps do not cause symptoms. If you have symptoms, they may include:  Blood coming from your rectum when having a bowel movement.  Blood in your stool. The stool may look dark red or black.  Abdominal pain.  A change in bowel habits, such as constipation or diarrhea. How is this diagnosed? This condition is diagnosed with a colonoscopy. This is a procedure in which a lighted, flexible scope is inserted into the anus and then passed into the colon to examine the area. Polyps are sometimes found when a colonoscopy is done as part of routine cancer screening tests. How is this treated? Treatment for this condition involves removing any polyps that are found. Most polyps can be removed during a colonoscopy. Those polyps will then be tested for cancer. Additional treatment may be needed depending on the results of testing. Follow these instructions at home: Lifestyle  Maintain a healthy weight, or lose weight if recommended by your health care provider.  Exercise every day or as told by your health care provider.  Do not use any products that contain nicotine or tobacco, such as  cigarettes and e-cigarettes. If you need help quitting, ask your health care provider.  If you drink alcohol, limit how much you have: ? 0-1 drink a day for women. ? 0-2 drinks a day for men.  Be aware of how much alcohol is in your drink. In the [place] one drink equals one  12 oz bottle of beer (355 mL), one 5 oz glass of wine (148 mL), or one 1 oz shot of hard liquor (44 mL). Eating and drinking   Eat foods that are high in fiber, such as fruits, vegetables, and whole grains.  Eat foods that are high in calcium and vitamin D, such as milk, cheese, yogurt, eggs, liver, fish, and broccoli.  Limit foods that are high in fat, such as fried foods and desserts.  Limit the amount of red meat and processed meat you eat, such as hot dogs, sausage, bacon, and lunch meats. General instructions  Keep all follow-up visits as told by your health care provider. This is important. ? This includes having regularly scheduled colonoscopies. ? Talk to your health care provider about when you need a colonoscopy. Contact a health care provider if:  You have new or worsening bleeding during a bowel movement.  You have new or increased blood in your stool.  You have a change in bowel habits.  You lose weight for no known reason. Summary  Polyps are tissue growths inside the body. Polyps can grow in many places, including the colon.  Most colon polyps are noncancerous (benign), but some can become cancerous over time.  This condition is diagnosed with a colonoscopy.  Treatment for this condition involves removing any polyps that are found. Most polyps can be removed during a colonoscopy. This information is not intended to replace advice given to you by your health care provider. Make sure you discuss any questions you have with your health care provider. Document Revised: 02/13/2018 Document Reviewed: 02/13/2018 Elsevier Patient Education  [place].  Liver Function Tests Why am I having this test? Liver function tests are done to see how well your liver is working. The proteins and enzymes measured in the test can alert your health care provider to inflammation, damage, or disease in your liver. It is common to have liver function tests:  When you are  taking certain medicines.  If you have liver disease.  If you drink a lot of alcohol.  When you are not feeling well.  When you have other conditions that may affect your liver.  During annual physical exams.  If you have symptoms such as yellowing of the skin (jaundice), abdominal pain, or nausea and vomiting. What is being tested? These tests measure various substances in your blood. This may include:  Alanine transaminase (ALT). This is an enzyme in the liver.  Aspartate transaminase (AST). This is an enzyme in the liver, heart, and muscles.  Alkaline phosphatase (ALP). This is a protein in the liver, bile ducts, bone, and other body tissues.  Total bilirubin. This is a yellow pigment in bile.  Albumin. This is a protein in the liver.  Prothrombin time and international normalized ratio (PT and INR). PT measures the time it takes for your blood to clot. INR is a calculation of blood clotting time based on your PT result. It is also calculated based on normal ranges defined by the lab that processed your test.  Total protein. This includes two proteins, albumin and globulin, found in the blood. What kind of sample is  taken?  A blood sample is required for this test. It is usually collected by inserting a needle into a blood vessel. How do I prepare for this test? How you prepare will depend on which tests are being done and the reason for doing them. You may need to:  Avoid eating for 4-6 hours before the test, or as told by your health care provider.  Stop taking certain medicines before your blood test, as told by your health care provider. Tell a health care provider about:  All medicines you are taking, including vitamins, herbs, eye drops, creams, and over-the-counter medicines.  Any medical conditions you have.  Whether you are pregnant or may be pregnant. How are the results reported? Your test results will be reported as values. Your health care provider will  compare your results to normal ranges that were established after testing a large group of people (reference ranges). Reference ranges may vary among labs and hospitals. For the substances measured in liver function tests, common reference ranges are: ALT  Infant: 10-40 international units/L.  Child or adult: 4-36 international units/L at 37C or 4-36 units/L (SI units).  Reference ranges may be higher for older adults. AST  Newborn 43-48 days old: 35-140 units/L.  Child younger than 69 years old: 15-60 units/L.  5-52 years old: 15-50 units/L.  82-73 years old: 10-50 units/L.  35-58 years old: 10-40 units/L.  Adult: 0-35 units/L or 0-0.58 microkatals/L (SI units).  Reference ranges may be higher for older adults. ALP  Child younger than 65 years old: 85-235 units/L.  21-92 years old: 65-210 units/L.  68-68 years old: 60-300 units/L.  73-25 years old: 30-200 units/L.  Adult: 30-120 units/L or 0.5-2.0 microkatals/L (SI units).  Reference ranges may be higher for older adults. Total bilirubin  Newborn: 1.0-12.0 mg/dL or 17.1-205 micromoles/L (SI units).  Child or adult: 0.3-1.0 mg/dL or 5.1-17 micromoles/L. Albumin  Premature infant: 3.0-4.2 g/dL.  Newborn: 3.5-5.4 g/dL.  Infant: 4.4-5.4 g/dL.  Child: 4.0-5.9 g/dL.  Adult: 3.5-5.0 g/dL or 35-50 g/L (SI units). PT  11.0-12.5 seconds; 85%-100%. INR  0.8-1.1. Total protein  Premature infant: 4.2-7.6 g/dL.  Newborn: 4.6-7.4 g/dL.  Infant: 6.0-6.7 g/dL.  Child: 6.2-8.0 g/dL.  Adult: 6.4-8.3 g/dL or 64-83 g/L (SI units). What do the results mean? Results that are within the reference ranges are considered normal. For each substance measured, results outside the reference range can indicate various health issues. ALT  Levels above the normal range may indicate liver disease. AST  Levels above the normal range may indicate liver disease. Sometimes levels also increase after burns, surgery, heart attack, muscle  damage, or seizure. ALP  Levels above the normal range may be seen in biliary obstruction, liver diseases, bone disease, thyroid disease, tumors, fractures, leukemia, lymphoma, or several other conditions. People with blood type O or B may show higher levels after a fatty meal.  Levels below the normal range may indicate bone and teeth conditions, malnutrition, protein deficiency, or Wilson's disease. Total bilirubin  Levels above the normal range may indicate problems with the liver, gallbladder, or bile ducts. Albumin  Levels above the normal range may indicate dehydration. They may also be caused by a diet that is high in protein.  Levels below the normal range may indicate kidney disease, liver disease, or malabsorption of nutrients. PT and INR  Levels above the normal range mean that your blood is clotting slower than normal. This may be due to blood disorders, liver disorders, or low levels of  vitamin K. Total protein  Levels above the normal range may be due to infection or other diseases.  Levels below the normal range may be due to an immune system disorder, bleeding, burns, kidney disorder, liver disease, trouble absorbing or getting nutrients, or other conditions that affect the intestines. Talk with your health care provider about what your results mean. Questions to ask your health care provider Ask your health care provider, or the department that is doing the test:  When will my results be ready?  How will I get my results?  What are my treatment options?  What other tests do I need?  What are my next steps? Summary  Liver function tests are done to see how well your liver is working.  These tests measure various proteins and enzymes in your blood. The results can alert your health care provider to inflammation, damage, or disease in your liver.  Talk with your health care provider about what your results mean. This information is not intended to replace  advice given to you by your health care provider. Make sure you discuss any questions you have with your health care provider. Document Revised: 06/18/2018 Document Reviewed: 08/13/2017 Elsevier Patient Education  [place].

## 2020-02-15 LAB — VITAMIN B12: Vitamin B-12: 496 pg/mL (ref 232–1245)

## 2020-02-15 LAB — VITAMIN B6: Vitamin B6: 42.2 ug/L (ref 5.3–46.7)

## 2020-02-28 ENCOUNTER — Ambulatory Visit: Payer: Self-pay | Attending: Internal Medicine

## 2020-02-28 DIAGNOSIS — Z23 Encounter for immunization: Secondary | ICD-10-CM

## 2020-02-28 NOTE — Progress Notes (Signed)
   Covid-19 Vaccination Clinic  Name:  Larry Johnson    MRN: [id number] DOB: 11/21/1960  02/28/2020  [name] was observed post Covid-19 immunization for 15 minutes without incident. He was provided with Vaccine Information Sheet and instruction to access the V-Safe system.   [name] was instructed to call 911 with any severe reactions post vaccine: Marland Kitchen Difficulty breathing  . Swelling of face and throat  . A fast heartbeat  . A bad rash all over body  . Dizziness and weakness   Immunizations Administered    Name Date Dose VIS Date Route   Pfizer COVID-19 Vaccine 02/28/2020  3:47 PM 0.3 mL 10/23/2019 Intramuscular   Manufacturer: [employer], [employer]   Lot: [id number]   NDC: [address]-1

## 2020-03-08 ENCOUNTER — Other Ambulatory Visit: Payer: Self-pay

## 2020-03-08 DIAGNOSIS — K219 Gastro-esophageal reflux disease without esophagitis: Secondary | ICD-10-CM

## 2020-03-10 MED ORDER — OMEPRAZOLE 20 MG PO CPDR: DELAYED_RELEASE_CAPSULE | ORAL | 1 refills | Status: DC

## 2020-03-16 ENCOUNTER — Other Ambulatory Visit: Payer: Self-pay

## 2020-03-16 DIAGNOSIS — K219 Gastro-esophageal reflux disease without esophagitis: Secondary | ICD-10-CM

## 2020-03-16 MED ORDER — OMEPRAZOLE 20 MG PO CPDR: DELAYED_RELEASE_CAPSULE | ORAL | 1 refills | Status: DC

## 2020-03-22 ENCOUNTER — Ambulatory Visit: Payer: Self-pay | Attending: Internal Medicine

## 2020-03-22 ENCOUNTER — Other Ambulatory Visit: Payer: Self-pay

## 2020-03-22 DIAGNOSIS — K219 Gastro-esophageal reflux disease without esophagitis: Secondary | ICD-10-CM

## 2020-03-22 DIAGNOSIS — Z23 Encounter for immunization: Secondary | ICD-10-CM

## 2020-03-22 MED ORDER — OMEPRAZOLE 20 MG PO CPDR: DELAYED_RELEASE_CAPSULE | ORAL | 2 refills | Status: DC

## 2020-03-22 NOTE — Progress Notes (Signed)
   Covid-19 Vaccination Clinic  Name:  STARR ENGEL    MRN: [id number] DOB: 1960-11-21  03/22/2020  Mr. Lesko was observed post Covid-19 immunization for 15 minutes without incident. He was provided with Vaccine Information Sheet and instruction to access the V-Safe system.   Mr. Manganaro was instructed to call 911 with any severe reactions post vaccine: Marland Kitchen Difficulty breathing  . Swelling of face and throat  . A fast heartbeat  . A bad rash all over body  . Dizziness and weakness   Immunizations Administered    Name Date Dose VIS Date Route   Pfizer COVID-19 Vaccine 03/22/2020  9:36 AM 0.3 mL 01/06/2019 Intramuscular   Manufacturer: [employer], [employer]   Lot: [id number]   NDC: [address]-8

## 2020-04-06 ENCOUNTER — Other Ambulatory Visit: Payer: Self-pay

## 2020-04-06 DIAGNOSIS — I10 Essential (primary) hypertension: Secondary | ICD-10-CM

## 2020-04-08 ENCOUNTER — Other Ambulatory Visit: Payer: Self-pay

## 2020-04-08 DIAGNOSIS — I10 Essential (primary) hypertension: Secondary | ICD-10-CM

## 2020-04-08 MED ORDER — LISINOPRIL-HYDROCHLOROTHIAZIDE 10-12.5 MG PO TABS: 1.0000 | ORAL_TABLET | Freq: Every morning | ORAL | 2 refills | Status: DC

## 2020-04-16 ENCOUNTER — Other Ambulatory Visit: Payer: Self-pay | Admitting: Physician Assistant

## 2020-04-22 ENCOUNTER — Other Ambulatory Visit: Payer: Self-pay

## 2020-04-22 ENCOUNTER — Other Ambulatory Visit: Payer: Self-pay | Admitting: Physician Assistant

## 2020-04-22 DIAGNOSIS — E7800 Pure hypercholesterolemia, unspecified: Secondary | ICD-10-CM

## 2020-04-22 MED ORDER — ATORVASTATIN CALCIUM 80 MG PO TABS: 80.0000 mg | ORAL_TABLET | Freq: Every day | ORAL | 2 refills | Status: DC

## 2020-04-28 ENCOUNTER — Other Ambulatory Visit: Payer: Self-pay

## 2020-04-28 DIAGNOSIS — I10 Essential (primary) hypertension: Secondary | ICD-10-CM

## 2020-04-28 MED ORDER — METOPROLOL SUCCINATE ER 50 MG PO TB24: 50.0000 mg | ORAL_TABLET | Freq: Every day | ORAL | 2 refills | Status: DC

## 2020-06-30 ENCOUNTER — Other Ambulatory Visit: Payer: Self-pay | Admitting: Registered Nurse

## 2020-06-30 DIAGNOSIS — M1A9XX1 Chronic gout, unspecified, with tophus (tophi): Secondary | ICD-10-CM

## 2020-06-30 DIAGNOSIS — Z792 Long term (current) use of antibiotics: Secondary | ICD-10-CM

## 2020-06-30 NOTE — Telephone Encounter (Signed)
[facility] pt. Thanks!

## 2020-08-05 ENCOUNTER — Telehealth: Payer: Self-pay | Admitting: Registered Nurse

## 2020-08-05 DIAGNOSIS — Z86010 Personal history of colonic polyps: Secondary | ICD-10-CM

## 2020-08-05 NOTE — Telephone Encounter (Signed)
Epic reviewed no GI notes noted.  My chart message sent to patient to ask if GI appt schedule since last office visit with me.  History of colonic polyps removed 2012 due follow up 2017 but had cardiac stents placed and on blood thinners and repeat stent 2019 and patient had not scheduled yet because then covid pandemic started.  Last office visit with me 02/10/2020.

## 2021-01-10 ENCOUNTER — Other Ambulatory Visit: Payer: Self-pay | Admitting: Physician Assistant

## 2021-01-10 DIAGNOSIS — I10 Essential (primary) hypertension: Secondary | ICD-10-CM

## 2021-01-13 ENCOUNTER — Other Ambulatory Visit: Payer: Self-pay

## 2021-01-13 DIAGNOSIS — I10 Essential (primary) hypertension: Secondary | ICD-10-CM

## 2021-01-13 NOTE — Telephone Encounter (Signed)
There was a Rx refill request in Epic - re-routed to Durward Parcel, PA-C.  AMD

## 2021-01-14 ENCOUNTER — Other Ambulatory Visit: Payer: Self-pay | Admitting: Physician Assistant

## 2021-01-14 DIAGNOSIS — I10 Essential (primary) hypertension: Secondary | ICD-10-CM

## 2021-01-14 DIAGNOSIS — E7800 Pure hypercholesterolemia, unspecified: Secondary | ICD-10-CM

## 2021-01-20 ENCOUNTER — Other Ambulatory Visit: Payer: Self-pay

## 2021-01-20 NOTE — Telephone Encounter (Signed)
Rx refill requests for Atorvastatin & Metoprolol were in Epic - re-routed to Durward Parcel, PA-C [facility] Interim Provider).  AMD

## 2021-01-24 NOTE — Progress Notes (Unsigned)
Physical scheduled 02/01/21 with Gavin Pound CL,RMA

## 2021-01-25 ENCOUNTER — Other Ambulatory Visit: Payer: Self-pay

## 2021-01-25 DIAGNOSIS — Z01818 Encounter for other preprocedural examination: Secondary | ICD-10-CM

## 2021-01-25 LAB — POCT URINALYSIS DIPSTICK
Bilirubin, UA: NEGATIVE
Blood, UA: NEGATIVE
Glucose, UA: NEGATIVE
Ketones, UA: NEGATIVE
Leukocytes, UA: NEGATIVE
Nitrite, UA: NEGATIVE
Protein, UA: POSITIVE — AB
Spec Grav, UA: >=1.03 — AB (ref 1.010–1.025)
Urobilinogen, UA: 0.2 E.U./dL
pH, UA: 5.5 (ref 5.0–8.0)

## 2021-01-25 NOTE — Addendum Note (Signed)
Addended by: Christianne Dolin F on: 01/25/2021 11:52 AM   Modules accepted: Orders

## 2021-01-26 LAB — CMP12+LP+TP+TSH+6AC+PSA+CBC…
ALT: 79 IU/L — ABNORMAL HIGH (ref 0–44)
AST: 45 IU/L — ABNORMAL HIGH (ref 0–40)
Albumin/Globulin Ratio: 1.9 (ref 1.2–2.2)
Albumin: 4.7 g/dL (ref 3.8–4.8)
Alkaline Phosphatase: 108 IU/L (ref 44–121)
BUN/Creatinine Ratio: 16 (ref 10–24)
BUN: 15 mg/dL (ref 8–27)
Basophils Absolute: 0 10*3/uL (ref 0.0–0.2)
Basos: 0 %
Bilirubin Total: 0.6 mg/dL (ref 0.0–1.2)
Calcium: 9.4 mg/dL (ref 8.6–10.2)
Chloride: 101 mmol/L (ref 96–106)
Chol/HDL Ratio: 3.1 ratio (ref 0.0–5.0)
Cholesterol, Total: 156 mg/dL (ref 100–199)
Creatinine, Ser: 0.95 mg/dL (ref 0.76–1.27)
EOS (ABSOLUTE): 0.2 10*3/uL (ref 0.0–0.4)
Eos: 4 %
Estimated CHD Risk: <0.5 times avg. (ref 0.0–1.0)
Free Thyroxine Index: 2.1 (ref 1.2–4.9)
GGT: 126 IU/L — ABNORMAL HIGH (ref 0–65)
Globulin, Total: 2.5 g/dL (ref 1.5–4.5)
Glucose: 107 mg/dL — ABNORMAL HIGH (ref 65–99)
HDL: 50 mg/dL (ref >39)
Hematocrit: 43.3 % (ref 37.5–51.0)
Hemoglobin: 14.4 g/dL (ref 13.0–17.7)
Immature Grans (Abs): 0 10*3/uL (ref 0.0–0.1)
Immature Granulocytes: 0 %
Iron: 94 ug/dL (ref 38–169)
LDH: 157 IU/L (ref 121–224)
LDL Chol Calc (NIH): 74 mg/dL (ref 0–99)
Lymphocytes Absolute: 2.2 10*3/uL (ref 0.7–3.1)
Lymphs: 40 %
MCH: 30.1 pg (ref 26.6–33.0)
MCHC: 33.3 g/dL (ref 31.5–35.7)
MCV: 90 fL (ref 79–97)
Monocytes Absolute: 0.5 10*3/uL (ref 0.1–0.9)
Monocytes: 8 %
Neutrophils Absolute: 2.6 10*3/uL (ref 1.4–7.0)
Neutrophils: 48 %
Phosphorus: 3.3 mg/dL (ref 2.8–4.1)
Platelets: 153 10*3/uL (ref 150–450)
Potassium: 4 mmol/L (ref 3.5–5.2)
Prostate Specific Ag, Serum: 0.7 ng/mL (ref 0.0–4.0)
RBC: 4.79 x10E6/uL (ref 4.14–5.80)
RDW: 13.2 % (ref 11.6–15.4)
Sodium: 142 mmol/L (ref 134–144)
T3 Uptake Ratio: 28 % (ref 24–39)
T4, Total: 7.6 ug/dL (ref 4.5–12.0)
TSH: 2.13 u[IU]/mL (ref 0.450–4.500)
Total Protein: 7.2 g/dL (ref 6.0–8.5)
Triglycerides: 190 mg/dL — ABNORMAL HIGH (ref 0–149)
Uric Acid: 4.5 mg/dL (ref 3.8–8.4)
VLDL Cholesterol Cal: 32 mg/dL (ref 5–40)
WBC: 5.5 10*3/uL (ref 3.4–10.8)
eGFR: 90 mL/min/{1.73_m2} (ref >59)

## 2021-02-01 ENCOUNTER — Other Ambulatory Visit: Payer: Self-pay

## 2021-02-01 ENCOUNTER — Ambulatory Visit: Payer: Self-pay | Admitting: Emergency Medicine

## 2021-02-01 VITALS — BP 147/87 | HR 70 | Temp 98.2°F | Resp 14 | Ht 72.0 in | Wt 224.0 lb

## 2021-02-01 DIAGNOSIS — Z0000 Encounter for general adult medical examination without abnormal findings: Secondary | ICD-10-CM

## 2021-02-01 NOTE — Progress Notes (Signed)
I have reviewed the triage vital signs and the nursing notes.   HISTORY  Chief Complaint Annual Exam   HPI Larry Johnson is a 60 y.o. male is here for annual physical.  Patient also has had some discomfort in his left wrist without new history of injury.  Patient states that when he was much younger he had a fracture to the left wrist and wore a cast.  He has not had any problems since then.  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  He denies any numbness or tingling.  He is able to make a fist without any difficulty and continues to lift.       Past Medical History:  Diagnosis Date  . Allergy   . CAD in native artery 07/13/2016   stent DES LAD and repeat 02/2018  . GERD (gastroesophageal reflux disease)   . Gout 2008   bilateral feet/toes  . Hyperlipidemia   . Hypertension     Patient Active Problem List   Diagnosis Date Noted  . Overweight 02/09/2020  . Elevated LDL cholesterol level 02/09/2020  . Decreased hearing 02/09/2020  . Allergic rhinitis 02/09/2020  . Degenerative joint disease 09/24/2017  . Thrombocytopenia ([place]) 07/19/2016  . Angina pectoris, unstable ([place]) 07/18/2016  . Chest pain, unspecified 07/17/2016  . GERD (gastroesophageal reflux disease) 07/17/2016  . CAD in native artery 07/13/2016  . Femoral neck fracture ([place]) 03/23/2013  . Hypertension 03/23/2013  . Gout 2008    Past Surgical History:  Procedure Laterality Date  . ANTERIOR CRUCIATE LIGAMENT REPAIR Right   . COLONOSCOPY    . hip replacemnt Left 2010   s/p fracture  . STENT PLACEMENT ILIAC ([place] [facility])  07/2016 & 02/2018    Prior to Admission medications   Medication Sig Start Date End Date Taking? Authorizing Provider  allopurinol (ZYLOPRIM) 300 MG tablet Take 1 tablet by mouth once daily 06/30/20  Yes Earleen Newport, MD  amoxicillin-clavulanate (AUGMENTIN) 875-125 MG tablet TAKE 1 TABLET BY MOUTH 1 HOUR PRIOR TO PROCEDURE AND  THEN 1 TABLET 6 HOURS AFTER PROCEDURE. 06/30/20  Yes Earleen Newport, MD  aspirin 81 MG EC tablet Take by mouth. 09/11/17  Yes [provider]  atorvastatin (LIPITOR) 80 MG tablet Take 1 tablet by mouth once daily 01/20/21  Yes Sable Feil, PA-C  lisinopril-hydrochlorothiazide (ZESTORETIC) 10-12.5 MG tablet TAKE 1 TABLET BY MOUTH ONCE DAILY IN THE MORNING 01/13/21  Yes Sable Feil, PA-C  loratadine (CLARITIN) 10 MG tablet Take 10 mg by mouth daily as needed for allergies.   Yes [provider]  metoprolol succinate (TOPROL-XL) 50 MG 24 hr tablet Take 1 tablet by mouth once daily 01/20/21  Yes Sable Feil, PA-C  metoprolol tartrate (LOPRESSOR) 25 MG tablet Take by mouth. 02/19/17  Yes [provider]  Multiple Vitamin (MULTI-VITAMIN) tablet Take by mouth.   Yes [provider]  omeprazole (PRILOSEC) 20 MG capsule SMARTSIG:1 Capsule(s) By Mouth 1 to 2 Times Daily PRN 03/22/20  Yes Sable Feil, PA-C    Allergies Eptifibatide  Family History  Problem Relation Age of Onset  . Multiple myeloma Mother   . Heart failure Father        At the end was told, nothing wrong with heart.    Social History Social History   Tobacco Use  . Smoking status: Former Smoker    Types: Cigarettes  . Smokeless tobacco:  Never Used    Review of Systems  Constitutional: No fever/chills Eyes: No visual changes. Cardiovascular: Denies chest pain. Respiratory: Denies shortness of breath. Gastrointestinal: No abdominal pain.  No nausea, no vomiting.   Genitourinary: Negative for dysuria. Musculoskeletal: Positive for left wrist pain. Skin: Negative for rash. Neurological: Negative for headaches, focal weakness or numbness. ____________________________________________   PHYSICAL EXAM: Constitutional: Alert and oriented. Well appearing and in no acute distress. Eyes: Conjunctivae are normal. PERRL. EOMI. Head: Atraumatic. Nose: No  congestion/rhinnorhea. Neck: No stridor.  Cardiovascular: Normal rate, regular rhythm. Grossly normal heart sounds.  Good peripheral circulation. Respiratory: Normal respiratory effort.  No retractions. Lungs CTAB. Gastrointestinal: Soft and nontender. No distention.  Bowel sounds are normoactive x4 quadrants. Musculoskeletal: No point tenderness is noted on palpation of thoracic or lumbar spine.  On examination of the wrist wrist there is some mild soft tissue edema noted on the radial aspect.  Patient is able to flex and extend.  Skin is intact and no warmth or redness is noted.  Patient is able to move upper and lower extremities without any difficulty and normal gait was noted. Neurologic:  Normal speech and language. No gross focal neurologic deficits are appreciated. No gait instability. Skin:  Skin is warm, dry and intact. No rash noted. Psychiatric: Mood and affect are normal. Speech and behavior are normal.  ____________________________________________   LABS (all labs ordered are listed, but only abnormal results are displayed)  Discussed with patient. ____________________________________________  EKG  Sinus bradycardia with ventricular rate of 54. ____________________________________________    FINAL CLINICAL IMPRESSION(S)   Patient is here for annual physical.  We discussed his left wrist.  If it continues to cause problems we will get an x-ray of his left wrist but today he will defer having this done.  He states that he has history of gout and continues to take the allopurinol and has not had a gout flareup in "long time".  Left wrist pain is most likely due to the fracture and some osteoarthritis.  Patient will call back if he changes his mind about having this x-rayed.   ED Discharge Orders    None       Note:  This document was prepared using Dragon voice recognition software and may include unintentional dictation errors.

## 2021-05-17 ENCOUNTER — Other Ambulatory Visit: Payer: Self-pay | Admitting: Physician Assistant

## 2021-05-17 DIAGNOSIS — K219 Gastro-esophageal reflux disease without esophagitis: Secondary | ICD-10-CM

## 2021-05-25 ENCOUNTER — Telehealth: Payer: Self-pay

## 2021-05-25 NOTE — Telephone Encounter (Signed)
Trey Paula called the clinic stating he was trying to get Rx for Prilosec refilled.  [facility] pharmacy told him that we hadn't responded.  Went to put in the refill request & a box opened up indicating there was a refill request for Prilosec sent to Durward Parcel, PA-C, but it was sent to the [facility] ED & not [facility].  Re-routed the request to Durward Parcel, PA-C at [facility] in-box.  AMD

## 2021-06-26 ENCOUNTER — Other Ambulatory Visit: Payer: Self-pay | Admitting: Emergency Medicine

## 2021-06-26 DIAGNOSIS — M1A9XX1 Chronic gout, unspecified, with tophus (tophi): Secondary | ICD-10-CM

## 2021-06-27 ENCOUNTER — Other Ambulatory Visit: Payer: Self-pay

## 2021-06-27 DIAGNOSIS — M1A9XX1 Chronic gout, unspecified, with tophus (tophi): Secondary | ICD-10-CM

## 2021-09-25 ENCOUNTER — Other Ambulatory Visit: Payer: Self-pay | Admitting: Physician Assistant

## 2021-09-25 DIAGNOSIS — M1A9XX1 Chronic gout, unspecified, with tophus (tophi): Secondary | ICD-10-CM

## 2021-10-02 ENCOUNTER — Other Ambulatory Visit: Payer: Self-pay | Admitting: Physician Assistant

## 2021-10-02 MED ORDER — ALLOPURINOL 300 MG PO TABS: 300.0000 mg | ORAL_TABLET | Freq: Every day | ORAL | 6 refills | Status: DC

## 2021-10-23 ENCOUNTER — Other Ambulatory Visit: Payer: Self-pay | Admitting: Physician Assistant

## 2021-10-23 DIAGNOSIS — I10 Essential (primary) hypertension: Secondary | ICD-10-CM

## 2021-10-23 DIAGNOSIS — E7800 Pure hypercholesterolemia, unspecified: Secondary | ICD-10-CM

## 2021-10-26 ENCOUNTER — Other Ambulatory Visit: Payer: Self-pay

## 2021-10-26 DIAGNOSIS — I10 Essential (primary) hypertension: Secondary | ICD-10-CM

## 2021-10-26 DIAGNOSIS — E7800 Pure hypercholesterolemia, unspecified: Secondary | ICD-10-CM

## 2021-10-26 MED ORDER — ATORVASTATIN CALCIUM 80 MG PO TABS: 80.0000 mg | ORAL_TABLET | Freq: Every day | ORAL | 2 refills | Status: DC

## 2021-10-26 MED ORDER — METOPROLOL SUCCINATE ER 50 MG PO TB24: 50.0000 mg | ORAL_TABLET | Freq: Every day | ORAL | 2 refills | Status: DC

## 2021-11-15 ENCOUNTER — Other Ambulatory Visit: Payer: Self-pay | Admitting: Physician Assistant

## 2021-11-15 DIAGNOSIS — I10 Essential (primary) hypertension: Secondary | ICD-10-CM

## 2021-11-20 ENCOUNTER — Other Ambulatory Visit: Payer: Self-pay

## 2022-01-26 ENCOUNTER — Other Ambulatory Visit: Payer: Self-pay

## 2022-01-26 ENCOUNTER — Ambulatory Visit: Payer: Self-pay

## 2022-01-26 DIAGNOSIS — Z0000 Encounter for general adult medical examination without abnormal findings: Secondary | ICD-10-CM

## 2022-01-26 LAB — POCT URINALYSIS DIPSTICK
Bilirubin, UA: NEGATIVE
Blood, UA: NEGATIVE
Glucose, UA: NEGATIVE
Ketones, UA: NEGATIVE
Leukocytes, UA: NEGATIVE
Nitrite, UA: NEGATIVE
Protein, UA: NEGATIVE
Spec Grav, UA: >=1.03 — AB (ref 1.010–1.025)
Urobilinogen, UA: 0.2 E.U./dL
pH, UA: 5.5 (ref 5.0–8.0)

## 2022-01-26 NOTE — Progress Notes (Signed)
01/30/22 physical ?

## 2022-01-27 LAB — CMP12+LP+TP+TSH+6AC+PSA+CBC…
ALT: 65 IU/L — ABNORMAL HIGH (ref 0–44)
AST: 39 IU/L (ref 0–40)
Albumin/Globulin Ratio: 2 (ref 1.2–2.2)
Albumin: 4.7 g/dL (ref 3.8–4.8)
Alkaline Phosphatase: 101 IU/L (ref 44–121)
BUN/Creatinine Ratio: 17 (ref 10–24)
BUN: 14 mg/dL (ref 8–27)
Basophils Absolute: 0 10*3/uL (ref 0.0–0.2)
Basos: 1 %
Bilirubin Total: 0.7 mg/dL (ref 0.0–1.2)
Calcium: 9.8 mg/dL (ref 8.6–10.2)
Chloride: 100 mmol/L (ref 96–106)
Chol/HDL Ratio: 3.2 ratio (ref 0.0–5.0)
Cholesterol, Total: 151 mg/dL (ref 100–199)
Creatinine, Ser: 0.81 mg/dL (ref 0.76–1.27)
EOS (ABSOLUTE): 0.3 10*3/uL (ref 0.0–0.4)
Eos: 5 %
Estimated CHD Risk: <0.5 times avg. (ref 0.0–1.0)
Free Thyroxine Index: 2.1 (ref 1.2–4.9)
GGT: 115 IU/L — ABNORMAL HIGH (ref 0–65)
Globulin, Total: 2.4 g/dL (ref 1.5–4.5)
Glucose: 96 mg/dL (ref 70–99)
HDL: 47 mg/dL (ref >39)
Hematocrit: 40.7 % (ref 37.5–51.0)
Hemoglobin: 14.3 g/dL (ref 13.0–17.7)
Immature Grans (Abs): 0 10*3/uL (ref 0.0–0.1)
Immature Granulocytes: 0 %
Iron: 128 ug/dL (ref 38–169)
LDH: 155 IU/L (ref 121–224)
LDL Chol Calc (NIH): 70 mg/dL (ref 0–99)
Lymphocytes Absolute: 2.2 10*3/uL (ref 0.7–3.1)
Lymphs: 39 %
MCH: 31.6 pg (ref 26.6–33.0)
MCHC: 35.1 g/dL (ref 31.5–35.7)
MCV: 90 fL (ref 79–97)
Monocytes Absolute: 0.5 10*3/uL (ref 0.1–0.9)
Monocytes: 9 %
Neutrophils Absolute: 2.7 10*3/uL (ref 1.4–7.0)
Neutrophils: 46 %
Phosphorus: 3.8 mg/dL (ref 2.8–4.1)
Platelets: 152 10*3/uL (ref 150–450)
Potassium: 4 mmol/L (ref 3.5–5.2)
Prostate Specific Ag, Serum: 0.8 ng/mL (ref 0.0–4.0)
RBC: 4.52 x10E6/uL (ref 4.14–5.80)
RDW: 13.1 % (ref 11.6–15.4)
Sodium: 141 mmol/L (ref 134–144)
T3 Uptake Ratio: 29 % (ref 24–39)
T4, Total: 7.4 ug/dL (ref 4.5–12.0)
TSH: 1.66 u[IU]/mL (ref 0.450–4.500)
Total Protein: 7.1 g/dL (ref 6.0–8.5)
Triglycerides: 204 mg/dL — ABNORMAL HIGH (ref 0–149)
Uric Acid: 4.5 mg/dL (ref 3.8–8.4)
VLDL Cholesterol Cal: 34 mg/dL (ref 5–40)
WBC: 5.7 10*3/uL (ref 3.4–10.8)
eGFR: 99 mL/min/{1.73_m2} (ref >59)

## 2022-01-30 ENCOUNTER — Ambulatory Visit: Payer: Self-pay | Admitting: Physician Assistant

## 2022-01-30 ENCOUNTER — Encounter: Payer: Self-pay | Admitting: Physician Assistant

## 2022-01-30 ENCOUNTER — Other Ambulatory Visit: Payer: Self-pay

## 2022-01-30 VITALS — BP 168/88 | HR 62 | Temp 97.6°F | Resp 14 | Ht 73.0 in | Wt 224.0 lb

## 2022-01-30 DIAGNOSIS — Z0000 Encounter for general adult medical examination without abnormal findings: Secondary | ICD-10-CM

## 2022-01-30 NOTE — Progress Notes (Signed)
Pt presents today to complete annual physical, pt denies any issues or concerns at this time.  ?

## 2022-01-30 NOTE — Progress Notes (Signed)
? ?[facility] ? ?____________________________________________ ? ? None  ?  (approximate) ? ?I have reviewed the triage vital signs and the nursing notes. ? ? ?HISTORY ? ?Chief Complaint ?Annual Exam ? ? ?HPI ?Larry Johnson is a 61 y.o. male patient presents annual physical exam.  Patient is most concerned for mild to moderate hearing loss.  Patient has a history of coronary artery disease, GERD, gout, hyperlipidemia, and hypertension. ?   ? ?  ? ? ?Past Medical History:  ?Diagnosis Date  ? Allergy   ? CAD in native artery 07/13/2016  ? stent DES LAD and repeat 02/2018  ? GERD (gastroesophageal reflux disease)   ? Gout 2008  ? bilateral feet/toes  ? Hyperlipidemia   ? Hypertension   ? ? ?Patient Active Problem List  ? Diagnosis Date Noted  ? Overweight 02/09/2020  ? Elevated LDL cholesterol level 02/09/2020  ? Decreased hearing 02/09/2020  ? Allergic rhinitis 02/09/2020  ? Degenerative joint disease 09/24/2017  ? Thrombocytopenia ([place]) 07/19/2016  ? Angina pectoris, unstable ([place]) 07/18/2016  ? Chest pain, unspecified 07/17/2016  ? GERD (gastroesophageal reflux disease) 07/17/2016  ? CAD in native artery 07/13/2016  ? Femoral neck fracture ([place]) 03/23/2013  ? Hypertension 03/23/2013  ? Gout 2008  ? ? ?Past Surgical History:  ?Procedure Laterality Date  ? ANTERIOR CRUCIATE LIGAMENT REPAIR Right   ? COLONOSCOPY    ? hip replacemnt Left 2010  ? s/p fracture  ? STENT PLACEMENT ILIAC ([place] [facility]X)  07/2016 & 02/2018  ? ? ?Prior to Admission medications   ?Medication Sig Start Date End Date Taking? Authorizing Provider  ?allopurinol (ZYLOPRIM) 300 MG tablet Take 1 tablet by mouth once daily 06/26/21  Yes Sable Feil, PA-C  ?aspirin 81 MG EC tablet Take by mouth. 09/11/17  Yes [provider]  ?atorvastatin (LIPITOR) 80 MG tablet Take 1 tablet (80 mg total) by mouth daily. 10/26/21  Yes Sable Feil, PA-C  ?lisinopril-hydrochlorothiazide (ZESTORETIC) 10-12.5 MG tablet Take 1 tablet by mouth daily.  11/20/21  Yes Sable Feil, PA-C  ?loratadine (CLARITIN) 10 MG tablet Take 10 mg by mouth daily as needed for allergies.   Yes [provider]  ?Multiple Vitamin (MULTI-VITAMIN) tablet Take by mouth.   Yes [provider]  ?omeprazole (PRILOSEC) 20 MG capsule TAKE 1 CAPSULE BY MOUTH 1 TO 2 TIMES DAILY AS NEEDED 05/25/21  Yes Sable Feil, PA-C  ?metoprolol succinate (TOPROL-XL) 50 MG 24 hr tablet Take 1 tablet (50 mg total) by mouth daily. Take with or immediately following a meal. 10/26/21   Sable Feil, PA-C  ?metoprolol tartrate (LOPRESSOR) 25 MG tablet Take by mouth. 02/19/17   [provider]  ? ? ?Allergies ?Eptifibatide ? ?Family History  ?Problem Relation Age of Onset  ? Multiple myeloma Mother   ? Heart failure Father   ?     At the end was told, nothing wrong with heart.  ? ? ?Social History ?Social History  ? ?Tobacco Use  ? Smoking status: Former  ?  Types: Cigarettes  ? Smokeless tobacco: Never  ? ? ?Review of Systems ?Constitutional: No fever/chills ?Eyes: No visual changes. ?ENT: No sore throat. ?Cardiovascular: Denies chest pain.  Coronary artery disease ?Respiratory: Denies shortness of breath. ?Gastrointestinal: No abdominal pain.  No nausea, no vomiting.  No diarrhea.  No constipation. ?Genitourinary: Negative for dysuria. ?Musculoskeletal: Negative for back pain. ?Skin: Negative for rash. ?Neurological: Negative for headaches, focal weakness or numbness. ?Endocrine: Hyperlipidemia  and hypertension. ?Hematological/Lymphatic: Thrombocytopenia ?Allergic/Immunilogical: Eptifibatide ? ?____________________________________________ ? ? ?PHYSICAL EXAM: ? ?VITAL SIGNS: Temperature 97.6, pulse 62, respiration 14, BP is 168/88, patient 90% O2 sat on room air.  Patient weighs 224 pounds and BMI is 29.55. ?Constitutional: Alert and oriented. Well appearing and in no acute distress. ?Eyes: Conjunctivae are normal. PERRL. EOMI. ?Head: Atraumatic. ?Nose: No  congestion/rhinnorhea. ?Mouth/Throat: Mucous membranes are moist.  Oropharynx non-erythematous. ?Neck: No stridor.  No cervical spine tenderness to palpation. ?Hematological/Lymphatic/Immunilogical: No cervical lymphadenopathy. ?Cardiovascular: Normal rate, regular rhythm. Grossly normal heart sounds.  Good peripheral circulation. ?Respiratory: Normal respiratory effort.  No retractions. Lungs CTAB. ?Gastrointestinal: Soft and nontender. No distention. No abdominal bruits. No CVA tenderness. ?Genitourinary: Deferred ?Musculoskeletal: No lower extremity tenderness nor edema.  No joint effusions. ?Neurologic:  Normal speech and language. No gross focal neurologic deficits are appreciated. No gait instability. ?Skin:  Skin is warm, dry and intact. No rash noted. ?Psychiatric: Mood and affect are normal. Speech and behavior are normal. ? ?____________________________________________ ?  ?LABS ?(all labs ordered are listed, but only abnormal results are displayed) ? ?      ?Component Ref Range & Units 4 d ago ?(01/26/22) 1 yr ago ?(01/25/21) 1 yr ago ?(02/03/20)  ?Color, UA  Amber  Yellow  Yellow   ?Clarity, UA  Clear  Clear  Clear   ?Glucose, UA Negative Negative  Negative  Negative   ?Bilirubin, UA  Negative  Negative  Negative   ?Ketones, UA  Negative  Negative  Negative   ?Spec Grav, UA 1.010 - 1.025 >=1.030 Abnormal   >=1.030 Abnormal   >=1.030 Abnormal    ?Blood, UA  Negative  Negative  Negative   ?pH, UA 5.0 - 8.0 5.5  5.5  5.5   ?Protein, UA Negative Negative  Positive Abnormal  CM  Positive Abnormal  CM   ?Urobilinogen, UA 0.2 or 1.0 E.U./dL 0.2  0.2  0.2   ?Nitrite, UA  Negative  Negative  Negative   ?Leukocytes, UA Negative Negative  Negative  Negative   ?Appearance        ?Odor        ?  ? ?  ?  ?Specimen Collected: 01/26/22 08:42 Last Resulted: 01/26/22 08:42  ?  ?  Lab Flowsheet   ? Order Details   ? View Encounter   ? Lab and Collection Details   ? Routing   ? Result History    ?View Encounter Conversation     ?  ?CM=Additional comments    ?  ?Result Care Coordination ? ? ?Patient Communication ? ? Add Comments   Seen Back to Top  ?  ?  ? ?Other Results from 01/26/2022 ? ? Contains abnormal data CMP12+LP+TP+TSH+6AC+PSA+CBC? ?Order: [id number] ?Status: Final result    ?Visible to patient: Yes (seen)    ?Next appt: None    ?Dx: Routine adult health maintenance    ?0 Result Notes ?         ?Component Ref Range & Units 4 d ago ?(01/26/22) 1 yr ago ?(01/25/21) 1 yr ago ?(02/03/20) 2 yr ago ?(03/31/19) 8 yr ago ?(05/22/13) 8 yr ago ?(05/12/13)  ?Glucose 70 - 99 mg/dL 96  107 High  R  99 R  97 R     ?Uric Acid 3.8 - 8.4 mg/dL 4.5  4.5 CM  4.2 CM  4.3 R, CM     ?Comment:            Therapeutic target for gout patients: <6.0  ?  BUN 8 - 27 mg/dL '14  15  13  11     ' ?Creatinine, Ser 0.76 - 1.27 mg/dL 0.81  0.95  0.80  0.85  0.97 R    ?eGFR >59 mL/min/1.73 99  90       ?BUN/Creatinine Ratio 10 - '24 17  16  16  13     ' ?Sodium 134 - 144 mmol/L 141  142  140  143     ?Potassium 3.5 - 5.2 mmol/L 4.0  4.0  4.3  4.4   4.3 R   ?Chloride 96 - 106 mmol/L 100  101  100  104     ?Calcium 8.6 - 10.2 mg/dL 9.8  9.4  9.5  9.6     ?Phosphorus 2.8 - 4.1 mg/dL 3.8  3.3  3.4  3.4     ?Total Protein 6.0 - 8.5 g/dL 7.1  7.2  7.2  7.0     ?Albumin 3.8 - 4.8 g/dL 4.7  4.7  4.8  4.6 R     ?Globulin, Total 1.5 - 4.5 g/dL 2.4  2.5  2.4  2.4     ?Albumin/Globulin Ratio 1.2 - 2.2 2.0  1.9  2.0  1.9     ?Bilirubin Total 0.0 - 1.2 mg/dL 0.7  0.6  0.4  0.5     ?Alkaline Phosphatase 44 - 121 IU/L 101  108  109 R  95 R     ?LDH 121 - 224 IU/L 155  157  159  174     ?AST 0 - 40 IU/L 39  45 High   41 High   41 High      ?ALT 0 - 44 IU/L 65 High   79 High   72 High   71 High      ?GGT 0 - 65 IU/L 115 High   126 High   90 High   102 High      ?Iron 38 - 169 ug/dL 128  94  86  102     ?Cholesterol, Total 100 - 199 mg/dL 151  156  159  152     ?Triglycerides 0 - 149 mg/dL 204 High   190 High   212 High   238 High      ?HDL >39 mg/dL 47  50  49  45     ?VLDL Cholesterol Cal 5 - 40  mg/dL 34  32  35  48 High      ?LDL Chol Calc (NIH) 0 - 99 mg/dL 70  74  75      ?Chol/HDL Ratio 0.0 - 5.0 ratio 3.2  3.1 CM  3.2 CM  3.4 CM     ?Comment:                                   T. Chol/HDL Ratio  ?

## 2022-02-13 ENCOUNTER — Other Ambulatory Visit: Payer: Self-pay | Admitting: Physician Assistant

## 2022-02-13 DIAGNOSIS — K219 Gastro-esophageal reflux disease without esophagitis: Secondary | ICD-10-CM

## 2022-02-15 ENCOUNTER — Other Ambulatory Visit: Payer: Self-pay

## 2022-02-15 NOTE — Telephone Encounter (Signed)
Larry Johnson called [facility] [place] [facility] requesting as Rx refill for Prilosec 20 mg. ? ?Upon entering Rx refill info into Epic, a box opened up for pended Rx refill request for Prilosec 20 mg that was sent electronically by patient's pharmacy to [facility] ED. ? ?Re-routed the pended Rx refill request for Prilosec 20 mg to Randel Pigg, PA-C. ? ?AMD ?

## 2022-03-19 ENCOUNTER — Telehealth: Payer: Self-pay

## 2022-03-19 NOTE — Telephone Encounter (Signed)
Called and LVM asking for patient to please return my call.  ? ?Patient is listed on our reports and no record of patient being seen here. ? ?OK for [facility] to confirm this information and find out who patient's PCP is.  ?

## 2022-03-22 NOTE — Telephone Encounter (Signed)
Called and LVM asking for patient to please return my call.  

## 2022-03-23 NOTE — Telephone Encounter (Signed)
Called and LVM asking for patient to please return my call.  

## 2022-04-18 ENCOUNTER — Other Ambulatory Visit: Payer: Self-pay | Admitting: Physician Assistant

## 2022-04-18 ENCOUNTER — Other Ambulatory Visit: Payer: Self-pay

## 2022-04-18 DIAGNOSIS — E7800 Pure hypercholesterolemia, unspecified: Secondary | ICD-10-CM

## 2022-04-18 MED ORDER — ATORVASTATIN CALCIUM 40 MG PO TABS: 40.0000 mg | ORAL_TABLET | Freq: Every day | ORAL | 3 refills | Status: DC

## 2022-04-23 ENCOUNTER — Telehealth: Payer: Self-pay

## 2022-04-23 NOTE — Telephone Encounter (Signed)
Larry Johnson called [facility] Friday (04/20/2022) questioning the dose of atorvastatin.  States he's been on 80 mg, but most recent prescription is for 40 mg.  Discussed the dosage of atorvastatin with Mardee Postin, PA-C.  Based on last labs, he did cut the dose of atorvastatin to 40 mg.  Called Larry Johnson & advised that Randel Pigg, PA-C did change the dose of atorvastatin to 40 mg. Verbalized understanding.  AMD

## 2022-07-16 ENCOUNTER — Other Ambulatory Visit: Payer: Self-pay | Admitting: Physician Assistant

## 2022-07-16 DIAGNOSIS — I10 Essential (primary) hypertension: Secondary | ICD-10-CM

## 2022-07-23 ENCOUNTER — Other Ambulatory Visit: Payer: Self-pay

## 2022-07-23 DIAGNOSIS — I10 Essential (primary) hypertension: Secondary | ICD-10-CM

## 2022-07-23 MED ORDER — METOPROLOL SUCCINATE ER 50 MG PO TB24: 50.0000 mg | ORAL_TABLET | Freq: Every day | ORAL | 3 refills | Status: DC

## 2022-12-21 ENCOUNTER — Other Ambulatory Visit: Payer: Self-pay | Admitting: Physician Assistant

## 2022-12-21 DIAGNOSIS — M1A9XX1 Chronic gout, unspecified, with tophus (tophi): Secondary | ICD-10-CM

## 2022-12-21 DIAGNOSIS — I10 Essential (primary) hypertension: Secondary | ICD-10-CM

## 2022-12-26 ENCOUNTER — Other Ambulatory Visit: Payer: Self-pay

## 2022-12-26 DIAGNOSIS — I10 Essential (primary) hypertension: Secondary | ICD-10-CM

## 2022-12-26 DIAGNOSIS — M1A9XX1 Chronic gout, unspecified, with tophus (tophi): Secondary | ICD-10-CM

## 2022-12-26 MED ORDER — LISINOPRIL-HYDROCHLOROTHIAZIDE 10-12.5 MG PO TABS: 1.0000 | ORAL_TABLET | Freq: Every day | ORAL | 3 refills | Status: AC

## 2022-12-26 MED ORDER — ALLOPURINOL 300 MG PO TABS: 300.0000 mg | ORAL_TABLET | Freq: Every day | ORAL | 3 refills | Status: AC

## 2023-01-11 ENCOUNTER — Ambulatory Visit: Payer: Self-pay

## 2023-01-11 DIAGNOSIS — Z0000 Encounter for general adult medical examination without abnormal findings: Secondary | ICD-10-CM

## 2023-01-11 LAB — POCT URINALYSIS DIPSTICK
Bilirubin, UA: POSITIVE
Blood, UA: NEGATIVE
Glucose, UA: NEGATIVE
Ketones, UA: NEGATIVE
Leukocytes, UA: NEGATIVE
Nitrite, UA: NEGATIVE
Protein, UA: POSITIVE — AB
Spec Grav, UA: >=1.03 — AB (ref 1.010–1.025)
Urobilinogen, UA: 0.2 E.U./dL
pH, UA: 5.5 (ref 5.0–8.0)

## 2023-01-12 LAB — CMP12+LP+TP+TSH+6AC+PSA+CBC…
ALT: 76 IU/L — ABNORMAL HIGH (ref 0–44)
AST: 45 IU/L — ABNORMAL HIGH (ref 0–40)
Albumin/Globulin Ratio: 2 (ref 1.2–2.2)
Albumin: 4.4 g/dL (ref 3.9–4.9)
Alkaline Phosphatase: 92 IU/L (ref 44–121)
BUN/Creatinine Ratio: 15 (ref 10–24)
BUN: 14 mg/dL (ref 8–27)
Basophils Absolute: 0 10*3/uL (ref 0.0–0.2)
Basos: 0 %
Bilirubin Total: 0.5 mg/dL (ref 0.0–1.2)
Calcium: 9.6 mg/dL (ref 8.6–10.2)
Chloride: 104 mmol/L (ref 96–106)
Chol/HDL Ratio: 3.1 ratio (ref 0.0–5.0)
Cholesterol, Total: 147 mg/dL (ref 100–199)
Creatinine, Ser: 0.94 mg/dL (ref 0.76–1.27)
EOS (ABSOLUTE): 0.2 10*3/uL (ref 0.0–0.4)
Eos: 4 %
Estimated CHD Risk: <0.5 times avg. (ref 0.0–1.0)
Free Thyroxine Index: 1.9 (ref 1.2–4.9)
GGT: 133 IU/L — ABNORMAL HIGH (ref 0–65)
Globulin, Total: 2.2 g/dL (ref 1.5–4.5)
Glucose: 110 mg/dL — ABNORMAL HIGH (ref 70–99)
HDL: 47 mg/dL (ref >39)
Hematocrit: 41.2 % (ref 37.5–51.0)
Hemoglobin: 14.3 g/dL (ref 13.0–17.7)
Immature Grans (Abs): 0 10*3/uL (ref 0.0–0.1)
Immature Granulocytes: 0 %
Iron: 73 ug/dL (ref 38–169)
LDH: 177 IU/L (ref 121–224)
LDL Chol Calc (NIH): 76 mg/dL (ref 0–99)
Lymphocytes Absolute: 2.1 10*3/uL (ref 0.7–3.1)
Lymphs: 42 %
MCH: 31.2 pg (ref 26.6–33.0)
MCHC: 34.7 g/dL (ref 31.5–35.7)
MCV: 90 fL (ref 79–97)
Monocytes Absolute: 0.4 10*3/uL (ref 0.1–0.9)
Monocytes: 7 %
Neutrophils Absolute: 2.3 10*3/uL (ref 1.4–7.0)
Neutrophils: 47 %
Phosphorus: 3.3 mg/dL (ref 2.8–4.1)
Platelets: 163 10*3/uL (ref 150–450)
Potassium: 4.2 mmol/L (ref 3.5–5.2)
Prostate Specific Ag, Serum: 0.7 ng/mL (ref 0.0–4.0)
RBC: 4.59 x10E6/uL (ref 4.14–5.80)
RDW: 12.8 % (ref 11.6–15.4)
Sodium: 144 mmol/L (ref 134–144)
T3 Uptake Ratio: 26 % (ref 24–39)
T4, Total: 7.2 ug/dL (ref 4.5–12.0)
TSH: 1.64 u[IU]/mL (ref 0.450–4.500)
Total Protein: 6.6 g/dL (ref 6.0–8.5)
Triglycerides: 134 mg/dL (ref 0–149)
Uric Acid: 4.8 mg/dL (ref 3.8–8.4)
VLDL Cholesterol Cal: 24 mg/dL (ref 5–40)
WBC: 5 10*3/uL (ref 3.4–10.8)
eGFR: 91 mL/min/{1.73_m2} (ref >59)

## 2023-01-15 ENCOUNTER — Encounter: Payer: Self-pay | Admitting: Physician Assistant

## 2023-01-15 ENCOUNTER — Other Ambulatory Visit: Payer: Self-pay

## 2023-01-15 ENCOUNTER — Ambulatory Visit: Payer: Self-pay | Admitting: Physician Assistant

## 2023-01-15 VITALS — BP 167/78 | HR 60 | Temp 97.3°F | Resp 14 | Wt 230.0 lb

## 2023-01-15 DIAGNOSIS — K219 Gastro-esophageal reflux disease without esophagitis: Secondary | ICD-10-CM

## 2023-01-15 DIAGNOSIS — Z0000 Encounter for general adult medical examination without abnormal findings: Secondary | ICD-10-CM

## 2023-01-15 MED ORDER — OMEPRAZOLE 20 MG PO CPDR: DELAYED_RELEASE_CAPSULE | ORAL | 3 refills | Status: AC

## 2023-01-15 NOTE — Progress Notes (Signed)
Here for yearly physical with provider as retired [employer] employee.  Voices no c/o today and stated taking meds as ordered.

## 2023-01-15 NOTE — Progress Notes (Signed)
[facility]  ____________________________________________   None    (approximate)  I have reviewed the triage vital signs and the nursing notes.   HISTORY  Chief Complaint No chief complaint on file.   HPI Larry Johnson is a 63 y.o. male patient presents for annual physical exam.  Patient voiced no concerns or complaints.         Past Medical History:  Diagnosis Date   Allergy    CAD in native artery 07/13/2016   stent DES LAD and repeat 02/2018   GERD (gastroesophageal reflux disease)    Gout 2008   bilateral feet/toes   Hyperlipidemia    Hypertension     Patient Active Problem List   Diagnosis Date Noted   Overweight 02/09/2020   Elevated LDL cholesterol level 02/09/2020   Decreased hearing 02/09/2020   Allergic rhinitis 02/09/2020   Degenerative joint disease 09/24/2017   Thrombocytopenia ([place]) 07/19/2016   Angina pectoris, unstable ([place]) 07/18/2016   Chest pain, unspecified 07/17/2016   GERD (gastroesophageal reflux disease) 07/17/2016   CAD in native artery 07/13/2016   Femoral neck fracture ([place]) 03/23/2013   Hypertension 03/23/2013   Gout 2008    Past Surgical History:  Procedure Laterality Date   ANTERIOR CRUCIATE LIGAMENT REPAIR Right    COLONOSCOPY     hip replacemnt Left 2010   s/p fracture   STENT PLACEMENT ILIAC ([place] HX)  07/2016 & 02/2018    Prior to Admission medications   Medication Sig Start Date End Date Taking? Authorizing Provider  allopurinol (ZYLOPRIM) 300 MG tablet Take 1 tablet (300 mg total) by mouth daily. 12/26/22  Yes Sable Feil, PA-C  aspirin 81 MG EC tablet Take by mouth. 09/11/17  Yes [provider]  atorvastatin (LIPITOR) 40 MG tablet Take 1 tablet (40 mg total) by mouth daily. 04/18/22  Yes Sable Feil, PA-C  lisinopril-hydrochlorothiazide (ZESTORETIC) 10-12.5 MG tablet Take 1 tablet by mouth daily. 12/26/22  Yes Sable Feil, PA-C  metoprolol succinate  (TOPROL-XL) 50 MG 24 hr tablet Take 1 tablet (50 mg total) by mouth daily. Take with or immediately following a meal. 07/23/22  Yes Sable Feil, PA-C  Multiple Vitamin (MULTI-VITAMIN) tablet Take by mouth.   Yes [provider]  omeprazole (PRILOSEC) 20 MG capsule TAKE 1 CAPSULE BY MOUTH 1 TO 2 TIMES DAILY AS NEEDED 02/15/22  Yes Sable Feil, PA-C  loratadine (CLARITIN) 10 MG tablet Take 10 mg by mouth daily as needed for allergies. Patient not taking: Reported on 01/15/2023    [provider]    Allergies Eptifibatide  Family History  Problem Relation Age of Onset   Multiple myeloma Mother    Heart failure Father        At the end was told, nothing wrong with heart.    Social History Social History   Tobacco Use   Smoking status: Former    Types: Cigarettes   Smokeless tobacco: Never    Review of Systems Constitutional: No fever/chills Eyes: No visual changes. ENT: No sore throat. Cardiovascular: Denies chest pain.  CAD  respiratory: Denies shortness of breath. Gastrointestinal: No abdominal pain.  No nausea, no vomiting.  No diarrhea.  No constipation.  GERD Genitourinary: Negative for dysuria. Musculoskeletal: Negative for back pain. Skin: Negative for rash. Neurological: Negative for headaches, focal weakness or numbness. Endocrine: Hyperlipidemia and hypertension Allergic/Immunilogical: Eptifibatide  ____________________________________________   PHYSICAL EXAM:  VITAL SIGNS: BP is 167/78.  Patient took  blood pressure medication prior to arrival.  Pulse 60, temperature 97.3, respiration 14, patient is 95% O2 sat on room air.  Patient weighs 230 pounds.  BMI is 30.34. Constitutional: Alert and oriented. Well appearing and in no acute distress. Eyes: Conjunctivae are normal. PERRL. EOMI. Head: Atraumatic. Nose: No congestion/rhinnorhea. Mouth/Throat: Mucous membranes are moist.  Oropharynx non-erythematous. Neck: No stridor.  No cervical spine  tenderness to palpation. Hematological/Lymphatic/Immunilogical: No cervical lymphadenopathy. Cardiovascular: Bradycardic e, regular rhythm. Grossly normal heart sounds.  Good peripheral circulation. Respiratory: Normal respiratory effort.  No retractions. Lungs CTAB. Gastrointestinal: Soft and nontender. No distention. No abdominal bruits. No CVA tenderness. Genitourinary: Deferred Musculoskeletal: No lower extremity tenderness nor edema.  No joint effusions. Neurologic:  Normal speech and language. No gross focal neurologic deficits are appreciated. No gait instability. Skin:  Skin is warm, dry and intact. No rash noted. Psychiatric: Mood and affect are normal. Speech and behavior are normal.  ____________________________________________   LABS         Component Ref Range & Units 4 d ago 11 mo ago 1 yr ago 2 yr ago  Color, UA  Amber Amber Yellow Yellow  Clarity, UA  Clear Clear Clear Clear  Glucose, UA Negative Negative Negative Negative Negative  Bilirubin, UA  Positive Negative Negative Negative  Ketones, UA  Negative Negative Negative Negative  Spec Grav, UA 1.010 - 1.025 >=1.030 Abnormal  >=1.030 Abnormal  >=1.030 Abnormal  >=1.030 Abnormal   Blood, UA  Negative Negative Negative Negative  pH, UA 5.0 - 8.0 5.5 5.5 5.5 5.5  Protein, UA Negative Positive Abnormal  Negative Positive Abnormal  CM Positive Abnormal  CM  Comment: 1+  Urobilinogen, UA 0.2 or 1.0 E.U./dL 0.2 0.2 0.2 0.2  Nitrite, UA  Negative Negative Negative Negative  Leukocytes, UA Negative Negative Negative Negative Negative  Appearance       Odor              Specimen Collected: 01/11/23 09:22 Last Resulted: 01/11/23 09:22      Lab Flowsheet      Order Details      View Encounter      Lab and Collection Details      Routing      Result History    View All Conversations on this Encounter      CM=Additional comments      Result Care Coordination   Patient Communication   Add Comments   Seen  Back to Top      Other Results from 01/11/2023   Contains abnormal data Male Executive Panel Order: [id number] Status: Final result      Visible to patient: Yes (seen)      Next appt: None      Dx: Routine adult health maintenance    0 Result Notes              Component Ref Range & Units 4 d ago (01/11/23) 11 mo ago (01/26/22) 1 yr ago (01/25/21) 2 yr ago (02/03/20) 3 yr ago (03/31/19) 9 yr ago (05/22/13) 9 yr ago (05/12/13)  Glucose 70 - 99 mg/dL 110 High  96 107 High  R 99 R 97 R    Uric Acid 3.8 - 8.4 mg/dL 4.8 4.5 CM 4.5 CM 4.2 CM 4.3 R, CM    Comment:            Therapeutic target for gout patients: <6.0  BUN 8 - 27 mg/dL '14 14 15 13 11    '$ Creatinine,  Ser 0.76 - 1.27 mg/dL 0.94 0.81 0.95 0.80 0.85 0.97 R   eGFR >59 mL/min/1.73 91 99 90      BUN/Creatinine Ratio 10 - '24 15 17 16 16 13    '$ Sodium 134 - 144 mmol/L 144 141 142 140 143    Potassium 3.5 - 5.2 mmol/L 4.2 4.0 4.0 4.3 4.4  4.3 R  Chloride 96 - 106 mmol/L 104 100 101 100 104    Calcium 8.6 - 10.2 mg/dL 9.6 9.8 9.4 9.5 9.6    Phosphorus 2.8 - 4.1 mg/dL 3.3 3.8 3.3 3.4 3.4    Total Protein 6.0 - 8.5 g/dL 6.6 7.1 7.2 7.2 7.0    Albumin 3.9 - 4.9 g/dL 4.4 4.7 R 4.7 R 4.8 R 4.6 R    Globulin, Total 1.5 - 4.5 g/dL 2.2 2.4 2.5 2.4 2.4    Albumin/Globulin Ratio 1.2 - 2.2 2.0 2.0 1.9 2.0 1.9    Bilirubin Total 0.0 - 1.2 mg/dL 0.5 0.7 0.6 0.4 0.5    Alkaline Phosphatase 44 - 121 IU/L 92 101 108 109 R 95 R    LDH 121 - 224 IU/L 177 155 157 159 174    AST 0 - 40 IU/L 45 High  39 45 High  41 High  41 High     ALT 0 - 44 IU/L 76 High  65 High  79 High  72 High  71 High     GGT 0 - 65 IU/L 133 High  115 High  126 High  90 High  102 High     Iron 38 - 169 ug/dL 73 128 94 86 102    Cholesterol, Total 100 - 199 mg/dL 147 151 156 159 152    Triglycerides 0 - 149 mg/dL 134 204 High  190 High  212 High  238 High     HDL >39 mg/dL 47 47 50 49 45    VLDL Cholesterol Cal 5 - 40 mg/dL 24 34 32 35 48 High     LDL Chol Calc (NIH) 0 -  99 mg/dL 76 70 74 75     Chol/HDL Ratio 0.0 - 5.0 ratio 3.1 3.2 CM 3.1 CM 3.2 CM 3.4 CM    Comment:                                   T. Chol/HDL Ratio                                             Men  Women                               1/2 Avg.Risk  3.4    3.3                                   Avg.Risk  5.0    4.4                                2X Avg.Risk  9.6    7.1  3X Avg.Risk 23.4   11.0  Estimated CHD Risk 0.0 - 1.0 times avg.  < 0.5  < 0.5 CM  < 0.5 CM  < 0.5 CM 0.5 CM    Comment: The CHD Risk is based on the T. Chol/HDL ratio. Other factors affect CHD Risk such as hypertension, smoking, diabetes, severe obesity, and family history of premature CHD.  TSH 0.450 - 4.500 uIU/mL 1.640 1.660 2.130 2.430 2.340    T4, Total 4.5 - 12.0 ug/dL 7.2 7.4 7.6 6.9 6.7    T3 Uptake Ratio 24 - 39 % '26 29 28 28 25    '$ Free Thyroxine Index 1.2 - 4.9 1.9 2.1 2.1 1.9 1.7    Prostate Specific Ag, Serum 0.0 - 4.0 ng/mL 0.7 0.8 CM 0.7 CM 0.7 CM 0.6 CM    Comment: Roche ECLIA methodology. According to the American Urological Association, Serum PSA should decrease and remain at undetectable levels after radical prostatectomy. The AUA defines biochemical recurrence as an initial PSA value 0.2 ng/mL or greater followed by a subsequent confirmatory PSA value 0.2 ng/mL or greater. Values obtained with different assay methods or kits cannot be used interchangeably. Results cannot be interpreted as absolute evidence of the presence or absence of malignant disease.  WBC 3.4 - 10.8 x10E3/uL 5.0 5.7 5.5 5.3 5.0    RBC 4.14 - 5.80 x10E6/uL 4.59 4.52 4.79 4.83 4.64    Hemoglobin 13.0 - 17.7 g/dL 14.3 14.3 14.4 14.4 14.3    Hematocrit 37.5 - 51.0 % 41.2 40.7 43.3 42.6 42.2    MCV 79 - 97 fL 90 90 90 88 91    MCH 26.6 - 33.0 pg 31.2 31.6 30.1 29.8 30.8    MCHC 31.5 - 35.7 g/dL 34.7 35.1 33.3 33.8 33.9    RDW 11.6 - 15.4 % 12.8 13.1 13.2 12.9 13.4    Platelets 150 - 450 x10E3/uL 163  152 153 162 150    Neutrophils Not Estab. % 47 46 48 46 46    Lymphs Not Estab. % 42 39 40 40 37    Monocytes Not Estab. % '7 9 8 9 9    '$ Eos Not Estab. % '4 5 4 4 7    '$ Basos Not Estab. % 0 1 0 1 1    Neutrophils Absolute 1.4 - 7.0 x10E3/uL 2.3 2.7 2.6 2.5 2.3    Lymphocytes Absolute 0.7 - 3.1 x10E3/uL 2.1 2.2 2.2 2.1 1.8    Monocytes Absolute 0.1 - 0.9 x10E3/uL 0.4 0.5 0.5 0.5 0.4    EOS (ABSOLUTE) 0.0 - 0.4 x10E3/uL 0.2 0.3 0.2 0.2 0.4    Basophils Absolute 0.0 - 0.2 x10E3/uL 0.0 0.0 0.0 0.0 0.1    Immature Granulocytes Not Estab. % 0 0 0 0 0                   ____________________________________________  EKG  Sinus bradycardia 54 bpm ____________________________________________    ____________________________________________   INITIAL IMPRESSION / ASSESSMENT AND PLAN  As part of my medical decision making, I reviewed the following data within the electronic MEDICAL RECORD NUMBER       Discussed no acute findings on physical exam, lab, and EKG.     ____________________________________________   FINAL CLINICAL IMPRESSION  Well exam  ED Discharge Orders     None        Note:  This document was prepared using Dragon voice recognition software and may include unintentional dictation errors.

## 2023-04-02 ENCOUNTER — Ambulatory Visit
Admission: RE | Admit: 2023-04-02 | Discharge: 2023-04-02 | Disposition: A | Discharge status: Home / Self Care | Payer: Self-pay | Source: Ambulatory Visit | Attending: Physician Assistant | Admitting: Physician Assistant

## 2023-04-02 ENCOUNTER — Ambulatory Visit
Admission: RE | Admit: 2023-04-02 | Discharge: 2023-04-02 | Disposition: A | Discharge status: Home / Self Care | Payer: Self-pay | Attending: Physician Assistant | Admitting: Physician Assistant

## 2023-04-02 ENCOUNTER — Ambulatory Visit: Payer: 59 | Admitting: Physician Assistant

## 2023-04-02 ENCOUNTER — Encounter: Payer: Self-pay | Admitting: Physician Assistant

## 2023-04-02 VITALS — BP 173/92 | HR 63 | Temp 97.8°F | Resp 16 | Wt 225.0 lb

## 2023-04-02 DIAGNOSIS — M542 Cervicalgia: Secondary | ICD-10-CM

## 2023-04-02 MED ORDER — KETOROLAC TROMETHAMINE 10 MG PO TABS: 10.0000 mg | ORAL_TABLET | Freq: Four times a day (QID) | ORAL | 0 refills | Status: AC | PRN

## 2023-04-02 MED ORDER — METHOCARBAMOL 750 MG PO TABS: 750.0000 mg | ORAL_TABLET | Freq: Four times a day (QID) | ORAL | 0 refills | Status: AC

## 2023-04-02 MED ORDER — KETOROLAC TROMETHAMINE 30 MG/ML IJ SOLN
30.0000 mg | Freq: Once | INTRAMUSCULAR | Status: AC
  Administered 2023-04-02: 30 mg via INTRAMUSCULAR

## 2023-04-02 NOTE — Progress Notes (Signed)
Reports day 4 of extreme neck rigidity with pain.  Denies any fall, injury, bites, fever, change in vision or nausea.  Stated mild headache now and trouble sleeping due to neck pain.  Stated family drove him here.  Points to pain right posterior neck and also on left side he stated.  With attempt at ROM of neck he is unable to rotate to the sides and only small movement of neck downward stated he is afraid to move and grimaces in pain.  Stated history of some stiffness of neck, but not this extreme.  Alert, oriented x 4 and appropriate in conversation.  Ambulated unassisted up to and inside clinic.

## 2023-04-02 NOTE — Progress Notes (Signed)
   Subjective: Neck pain    Patient ID: Larry Johnson, male    DOB: 1961-01-16, 62 y.o.   MRN: [id number]  HPI Patient complains of neck pain for 4 days.  Stated waking with complaint 4 days ago.  No relief with over-the-counter anti-inflammatory medication.  Denies loss sensation to upper extremities.  Complaint a few years ago.   Review of Systems Cardiology disease, hyperlipidemia, hypertension, GERD gout, and degenerative joint disease.   Objective:   Physical Exam BP 173/92  Pulse 63  Resp 16  Temp 97.8 F (36.6 C)  SpO2 96 %  Weight 225 lb (102.1 kg)  Appears to be moderate distress.  No obvious spinal deformity.  No guarding with palpation.  Decreased range of motion in all fields.       Assessment & Plan: Torticollis  Patient given Toradol 60 mg IM followed by oral medication for 5 days.  Patient also given prescription for Robaxin.  Patient sent for cervical spine x-ray.  Patient will follow-up in 2 days.

## 2023-04-04 ENCOUNTER — Encounter: Payer: Self-pay | Admitting: Physician Assistant

## 2023-04-04 ENCOUNTER — Ambulatory Visit: Payer: Self-pay | Admitting: Physician Assistant

## 2023-04-04 VITALS — BP 158/86 | HR 75 | Temp 98.0°F | Resp 16

## 2023-04-04 DIAGNOSIS — M436 Torticollis: Secondary | ICD-10-CM

## 2023-04-04 NOTE — Progress Notes (Signed)
Follow up for neck stiffness/pain post xrays and taking meds.  Stated he is improving and observed improved ROM of neck side to side and downward, but still limited. BP rechecked with manual.

## 2023-04-04 NOTE — Progress Notes (Signed)
   Subjective: Follow-up neck pain    Patient ID: Larry Johnson, male    DOB: 01-Apr-1961, 62 y.o.   MRN: [id number]  HPI Patient follow-up status post 3 days of neck pain at 5 AM awakening.  Patient was diagnosed with torticollis and given a prescription for anti-inflammatory medication and muscle relaxant.  Patient also advised to apply heat to the area.  Patient also advised gradual stretching exercises of the neck.  Patient was sent for imaging which were unremarkable except for degenerative changes in the cervical spine.  Patient states feeling better and has increased but not full range of motion of the cervical spine.  Denies numbness or tingling to the upper extremities.   Review of Systems Allergic rhinitis, cardiac artery disease, GERD, and degenerative joint disease.    Objective:   Physical Exam  BP 158/86  Pulse 75  Resp 16  Temp 98 F (36.7 C)  SpO2 97 %  No acute distress.  Patient has increased range of motion is all fields.     Assessment & Plan: Torticollis  Continue present treatment plan and follow-up with no complete resolution in 1 week.

## 2023-04-10 ENCOUNTER — Other Ambulatory Visit: Payer: Self-pay | Admitting: Physician Assistant

## 2023-04-10 DIAGNOSIS — E7800 Pure hypercholesterolemia, unspecified: Secondary | ICD-10-CM

## 2023-04-17 ENCOUNTER — Other Ambulatory Visit: Payer: Self-pay

## 2023-04-17 NOTE — Telephone Encounter (Signed)
Duplicate refill request for Atorvastatin.  Pharmacy sent a refill request for Atorvastatin electronically & by fax.  Routed the refill request already in Epic to Durward Parcel, PA-C  AMD

## 2023-06-05 DIAGNOSIS — C4441 Basal cell carcinoma of skin of scalp and neck: Secondary | ICD-10-CM | POA: Diagnosis not present

## 2023-06-05 DIAGNOSIS — X32XXXA Exposure to sunlight, initial encounter: Secondary | ICD-10-CM | POA: Diagnosis not present

## 2023-06-05 DIAGNOSIS — D485 Neoplasm of uncertain behavior of skin: Secondary | ICD-10-CM | POA: Diagnosis not present

## 2023-06-05 DIAGNOSIS — C44319 Basal cell carcinoma of skin of other parts of face: Secondary | ICD-10-CM | POA: Diagnosis not present

## 2023-06-05 DIAGNOSIS — L918 Other hypertrophic disorders of the skin: Secondary | ICD-10-CM | POA: Diagnosis not present

## 2023-06-05 DIAGNOSIS — Z08 Encounter for follow-up examination after completed treatment for malignant neoplasm: Secondary | ICD-10-CM | POA: Diagnosis not present

## 2023-06-05 DIAGNOSIS — L578 Other skin changes due to chronic exposure to nonionizing radiation: Secondary | ICD-10-CM | POA: Diagnosis not present

## 2023-06-05 DIAGNOSIS — L538 Other specified erythematous conditions: Secondary | ICD-10-CM | POA: Diagnosis not present

## 2023-06-05 DIAGNOSIS — Z85828 Personal history of other malignant neoplasm of skin: Secondary | ICD-10-CM | POA: Diagnosis not present

## 2023-06-05 DIAGNOSIS — L570 Actinic keratosis: Secondary | ICD-10-CM | POA: Diagnosis not present

## 2023-07-23 ENCOUNTER — Other Ambulatory Visit: Payer: Self-pay | Admitting: Physician Assistant

## 2023-07-23 DIAGNOSIS — I10 Essential (primary) hypertension: Secondary | ICD-10-CM

## 2023-07-31 ENCOUNTER — Other Ambulatory Visit: Payer: Self-pay

## 2023-07-31 DIAGNOSIS — I10 Essential (primary) hypertension: Secondary | ICD-10-CM

## 2023-07-31 MED ORDER — METOPROLOL SUCCINATE ER 50 MG PO TB24: 50.0000 mg | ORAL_TABLET | Freq: Every day | ORAL | 3 refills | Status: AC
